# Patient Record
Sex: Female | Born: 1950 | Race: White | Hispanic: No | Marital: Married | Smoking: Never smoker
Health system: Southern US, Community
[De-identification: ages and names within clinical notes are randomized; demographics above are authoritative.]

## PROBLEM LIST (undated history)

## (undated) DIAGNOSIS — M1990 Unspecified osteoarthritis, unspecified site: Secondary | ICD-10-CM

## (undated) DIAGNOSIS — K219 Gastro-esophageal reflux disease without esophagitis: Secondary | ICD-10-CM

## (undated) DIAGNOSIS — E785 Hyperlipidemia, unspecified: Secondary | ICD-10-CM

## (undated) DIAGNOSIS — F32A Depression, unspecified: Secondary | ICD-10-CM

## (undated) DIAGNOSIS — F329 Major depressive disorder, single episode, unspecified: Secondary | ICD-10-CM

## (undated) DIAGNOSIS — R0602 Shortness of breath: Secondary | ICD-10-CM

## (undated) HISTORY — DX: Gastro-esophageal reflux disease without esophagitis: K21.9

## (undated) HISTORY — DX: Unspecified osteoarthritis, unspecified site: M19.90

## (undated) HISTORY — DX: Major depressive disorder, single episode, unspecified: F32.9

## (undated) HISTORY — DX: Hyperlipidemia, unspecified: E78.5

## (undated) HISTORY — DX: Shortness of breath: R06.02

## (undated) HISTORY — DX: Depression, unspecified: F32.A

---

## 2001-08-17 HISTORY — PX: OTHER SURGICAL HISTORY: SHX169

## 2014-10-03 ENCOUNTER — Ambulatory Visit (INDEPENDENT_AMBULATORY_CARE_PROVIDER_SITE_OTHER): Payer: Managed Care, Other (non HMO)

## 2014-10-03 ENCOUNTER — Ambulatory Visit (INDEPENDENT_AMBULATORY_CARE_PROVIDER_SITE_OTHER): Payer: Managed Care, Other (non HMO) | Admitting: Emergency Medicine

## 2014-10-03 VITALS — BP 102/68 | HR 82 | Temp 97.9°F | Resp 16 | Ht 63.0 in | Wt 156.0 lb

## 2014-10-03 DIAGNOSIS — M79641 Pain in right hand: Secondary | ICD-10-CM

## 2014-10-03 NOTE — Patient Instructions (Signed)
Hand Contusion °A hand contusion is a deep bruise on your hand area. Contusions are the result of an injury that caused bleeding under the skin. The contusion may turn blue, purple, or yellow. Minor injuries will give you a painless contusion, but more severe contusions may stay painful and swollen for a few weeks. °CAUSES  °A contusion is usually caused by a blow, trauma, or direct force to an area of the body. °SYMPTOMS  °· Swelling and redness of the injured area. °· Discoloration of the injured area. °· Tenderness and soreness of the injured area. °· Pain. °DIAGNOSIS  °The diagnosis can be made by taking a history and performing a physical exam. An X-ray, CT scan, or MRI may be needed to determine if there were any associated injuries, such as broken bones (fractures). °TREATMENT  °Often, the best treatment for a hand contusion is resting, elevating, icing, and applying cold compresses to the injured area. Over-the-counter medicines may also be recommended for pain control. °HOME CARE INSTRUCTIONS  °· Put ice on the injured area. °¨ Put ice in a plastic bag. °¨ Place a towel between your skin and the bag. °¨ Leave the ice on for 15-20 minutes, 03-04 times a day. °· Only take over-the-counter or prescription medicines as directed by your caregiver. Your caregiver may recommend avoiding anti-inflammatory medicines (aspirin, ibuprofen, and naproxen) for 48 hours because these medicines may increase bruising. °· If told, use an elastic wrap as directed. This can help reduce swelling. You may remove the wrap for sleeping, showering, and bathing. If your fingers become numb, cold, or blue, take the wrap off and reapply it more loosely. °· Elevate your hand with pillows to reduce swelling. °· Avoid overusing your hand if it is painful. °SEEK IMMEDIATE MEDICAL CARE IF:  °· You have increased redness, swelling, or pain in your hand. °· Your swelling or pain is not relieved with medicines. °· You have loss of feeling in  your hand or are unable to move your fingers. °· Your hand turns cold or blue. °· You have pain when you move your fingers. °· Your hand becomes warm to the touch. °· Your contusion does not improve in 2 days. °MAKE SURE YOU:  °· Understand these instructions. °· Will watch your condition. °· Will get help right away if you are not doing well or get worse. °Document Released: 01/23/2002 Document Revised: 04/27/2012 Document Reviewed: 01/25/2012 °ExitCare® Patient Information ©2015 [employer]. This information is not intended to replace advice given to you by your health care provider. Make sure you discuss any questions you have with your health care provider. ° °
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
## 2015-11-06 ENCOUNTER — Telehealth: Payer: Self-pay | Admitting: Cardiovascular Disease

## 2015-11-06 NOTE — Telephone Encounter (Signed)
Received records from [facility] for appointment on 11/14/15 with Dr [place].  Records given to N Hines (medical records) for Dr Formoso's schedule on 11/14/15. lp °

## 2015-11-14 ENCOUNTER — Ambulatory Visit: Payer: Self-pay | Admitting: Cardiovascular Disease

## 2015-11-22 ENCOUNTER — Encounter: Payer: Self-pay | Admitting: Cardiovascular Disease

## 2015-11-22 ENCOUNTER — Ambulatory Visit (INDEPENDENT_AMBULATORY_CARE_PROVIDER_SITE_OTHER): Payer: Managed Care, Other (non HMO) | Admitting: Cardiovascular Disease

## 2015-11-22 VITALS — BP 110/80 | HR 73 | Ht 63.0 in | Wt 157.0 lb

## 2015-11-22 DIAGNOSIS — R5383 Other fatigue: Secondary | ICD-10-CM

## 2015-11-22 DIAGNOSIS — R0602 Shortness of breath: Secondary | ICD-10-CM

## 2015-11-22 DIAGNOSIS — Z13220 Encounter for screening for lipoid disorders: Secondary | ICD-10-CM

## 2015-11-22 NOTE — Patient Instructions (Addendum)
Medication Instructions:  Your physician recommends that you continue on your current medications as directed. Please refer to the Current Medication list given to you today.  Labwork: Fasting Lipid panel first floor at [facility] lab soon  Testing/Procedures: Your physician has requested that you have en exercise stress myoview. For further information please visit [email or web address]. Please follow instruction sheet, as given.  Follow-Up: Your physician recommends that you schedule a follow-up appointment in: 1 month ov   If you need a refill on your cardiac medications before your next appointment, please call your pharmacy.

## 2015-11-22 NOTE — Progress Notes (Signed)
Cardiology Office Note   Date:  11/23/2015   ID:  Katherine Heath, DOB 1950-12-28, MRN [id number]  PCP:  No PCP Per Patient  Cardiologist:   Sharol Harness, MD   Chief Complaint  Patient presents with  . New Patient (Initial Visit)    Feels exhausted after exercise for 3 years      History of Present Illness: Katherine Heath is a 65 y.o. female who presents for an evaluation of shortness of breath.  Katherine Heath saw her PCP, Dr. [name] on 11/05/15.  At that appointment she reported shortness of breath. Her exam was unremarkable. She was referred to cardiology for further evaluation.  Hemoglobin was 14.9 and her thyroid function was stable.  She walks her dogs daily for 45 minutes most days of the week. She is noted shortness of breath and fatigue with walking. This shortness of breath improves after exercising but the fatigue persist throughout the day. On days when she does not exercise she does not have this fatigue. She does not get any chest pain, lightheadedness, dizziness, or palpitations with exertion. Several years ago she had dizziness for approximately one week but has none recently.  Her fatigue has been ongoing for approximately 3 years and is unchanged.  Katherine Heath is no swelling around her ankles and warmer weather but none lately. She denies orthopnea or PND. She does endorse snoring and her husband states that she stops breathing overnight. She had a sleep study 10-15 years ago when she had similar symptoms and was told that she did not have sleep apnea.  Her diet is good.  She eats lots of fruits and vegetables but struggles with carbohydrates.  She gained 12-14 lb in 2 years which she attributes to stopping ADHD medication.   Past Medical History  Diagnosis Date  . Depression   . GERD (gastroesophageal reflux disease)   . Arthritis   . Shortness of breath 11/23/2015    Past Surgical History  Procedure Laterality Date  . Sphincterotomy  2003     Current  Outpatient Prescriptions  Medication Sig Dispense Refill  . Cholecalciferol (VITAMIN D-3 PO) Take by mouth daily. 6000 iu daily    . clonazePAM (KLONOPIN) 0.5 MG tablet Take 0.5 mg by mouth as needed for anxiety. Take 1/2 tablet PRN    . Estriol POWD by Does not apply route 3 (three) times a week.    . Progesterone Micronized (PROGESTERONE PO) Take 70 mg by mouth daily. 5 days a week    . PROGESTERONE, VAGINAL, 4 % GEL Place vaginally daily.     No current facility-administered medications for this visit.    Allergies:   Latex    Social History:  The patient  reports that she has never smoked. She does not have any smokeless tobacco history on file. She reports that she drinks alcohol. She reports that she does not use illicit drugs.   Family History:  The patient's family history includes Heart disease in her father, maternal grandmother, and paternal grandfather; Hypertension in her maternal grandmother; Stroke in her maternal grandfather.    ROS:  Please see the history of present illness.   Otherwise, review of systems are positive for hemorrhoids.   All other systems are reviewed and negative.    PHYSICAL EXAM: VS:  BP 110/80 mmHg  Pulse 73  Ht 5\' 3"  (1.6 m)  Wt 71.215 kg (157 lb)  BMI 27.82 kg/m2 , BMI Body mass index is 27.82 kg/(m^2). GENERAL:  Well appearing HEENT:  Pupils equal round and reactive, fundi not visualized, oral mucosa unremarkable NECK:  No jugular venous distention, waveform within normal limits, carotid upstroke brisk and symmetric, no bruits, no thyromegaly LYMPHATICS:  No cervical adenopathy LUNGS:  Clear to auscultation bilaterally HEART:  RRR.  PMI not displaced or sustained,S1 and S2 within normal limits, no S3, no S4, no clicks, no rubs, no murmurs ABD:  Flat, positive bowel sounds normal in frequency in pitch, no bruits, no rebound, no guarding, no midline pulsatile mass, no hepatomegaly, no splenomegaly EXT:  2 plus pulses throughout, no edema, no  cyanosis no clubbing SKIN:  No rashes no nodules NEURO:  Cranial nerves II through XII grossly intact, motor grossly intact throughout PSYCH:  Cognitively intact, oriented to person place and time    EKG:  EKG is ordered today. The ekg ordered today demonstrates sinus rhythm rate 73 bpm.   Recent Labs: No results found for requested labs within last 365 days.   10/15/15: Sodium 137, potassium 4.4, BUN 23, creatinine 0.7 AST 19, ALT 20 WBC 8, hemoglobin 14.9, hematocrit 44.8, platelets 336 TSH 1.7  Lipid Panel No results found for: CHOL, TRIG, HDL, CHOLHDL, VLDL, LDLCALC, LDLDIRECT    Wt Readings from Last 3 Encounters:  11/22/15 71.215 kg (157 lb)  10/03/14 70.761 kg (156 lb)      ASSESSMENT AND PLAN:  # Shortness of breath, fatigue: Symptoms are atypical but could represent coronary disease. She does not have any evidence of heart failure on exam. We will obtain an exercise Cardiolite to evaluate for ischemia. Thyroid function and blood counts are stable.  # CV Risk assessment: We will obtain a fasting lipid panel.   Current medicines are reviewed at length with the patient today.  The patient does not have concerns regarding medicines.  The following changes have been made:  no change  Labs/ tests ordered today include:   Orders Placed This Encounter  Procedures  . Lipid panel  . Myocardial Perfusion Imaging  . EKG 12-Lead     Disposition:   FU with Tiffany C. Oval Linsey, MD, [facility] in 1 month   This note was written with the assistance of speech recognition software.  Please excuse any transcriptional errors.  Signed, Tiffany C. Oval Linsey, MD, [facility]  11/23/2015 7:55 AM    [place]

## 2015-11-23 ENCOUNTER — Encounter: Payer: Self-pay | Admitting: Cardiovascular Disease

## 2015-11-23 DIAGNOSIS — R0602 Shortness of breath: Secondary | ICD-10-CM

## 2015-11-23 DIAGNOSIS — R5383 Other fatigue: Secondary | ICD-10-CM | POA: Insufficient documentation

## 2015-11-23 HISTORY — DX: Shortness of breath: R06.02

## 2015-11-26 LAB — LIPID PANEL
Cholesterol: 241 mg/dL — ABNORMAL HIGH (ref 125–200)
HDL: 45 mg/dL — ABNORMAL LOW (ref >=46)
LDL Cholesterol: 165 mg/dL — ABNORMAL HIGH (ref <130)
Total CHOL/HDL Ratio: 5.4 Ratio — ABNORMAL HIGH (ref <=5.0)
Triglycerides: 154 mg/dL — ABNORMAL HIGH (ref <150)
VLDL: 31 mg/dL — ABNORMAL HIGH (ref <30)

## 2015-12-04 ENCOUNTER — Telehealth (HOSPITAL_COMMUNITY): Payer: Self-pay

## 2015-12-04 NOTE — Telephone Encounter (Signed)
Encounter complete. 

## 2015-12-06 ENCOUNTER — Ambulatory Visit (HOSPITAL_COMMUNITY)
Admission: RE | Admit: 2015-12-06 | Discharge: 2015-12-06 | Disposition: A | Discharge status: Home / Self Care | Payer: Managed Care, Other (non HMO) | Source: Ambulatory Visit | Attending: Cardiology | Admitting: Cardiology

## 2015-12-06 DIAGNOSIS — R0602 Shortness of breath: Secondary | ICD-10-CM

## 2015-12-06 DIAGNOSIS — R5383 Other fatigue: Secondary | ICD-10-CM

## 2015-12-24 ENCOUNTER — Telehealth: Payer: Self-pay | Admitting: Cardiovascular Disease

## 2015-12-27 ENCOUNTER — Ambulatory Visit: Payer: Managed Care, Other (non HMO) | Admitting: Cardiovascular Disease

## 2015-12-27 NOTE — Telephone Encounter (Signed)
Closed encounter °

## 2016-01-03 LAB — HM COLONOSCOPY

## 2016-01-09 ENCOUNTER — Ambulatory Visit: Payer: Managed Care, Other (non HMO) | Admitting: Cardiovascular Disease

## 2016-01-16 ENCOUNTER — Telehealth: Payer: Self-pay | Admitting: *Deleted

## 2016-01-16 DIAGNOSIS — K219 Gastro-esophageal reflux disease without esophagitis: Secondary | ICD-10-CM | POA: Diagnosis not present

## 2016-01-16 DIAGNOSIS — K580 Irritable bowel syndrome with diarrhea: Secondary | ICD-10-CM | POA: Diagnosis not present

## 2016-01-16 NOTE — Telephone Encounter (Signed)
Patient (pt will call back to reshcl; ah), this was cancelled on 12/24/15

## 2016-01-22 NOTE — Telephone Encounter (Signed)
Cancelled follow up ov and myoview

## 2016-01-30 ENCOUNTER — Telehealth (HOSPITAL_COMMUNITY): Payer: Self-pay | Admitting: *Deleted

## 2016-01-30 NOTE — Telephone Encounter (Signed)
Left message for patient to call regarding rescheduling myoview that she cancelled

## 2016-02-21 ENCOUNTER — Telehealth (HOSPITAL_COMMUNITY): Payer: Self-pay

## 2016-02-21 NOTE — Telephone Encounter (Signed)
Encounter complete. 

## 2016-02-26 ENCOUNTER — Ambulatory Visit (HOSPITAL_COMMUNITY)
Admission: RE | Admit: 2016-02-26 | Discharge: 2016-02-26 | Disposition: A | Discharge status: Home / Self Care | Payer: Medicare Other | Source: Ambulatory Visit | Attending: Cardiovascular Disease | Admitting: Cardiovascular Disease

## 2016-02-26 DIAGNOSIS — R0602 Shortness of breath: Secondary | ICD-10-CM | POA: Insufficient documentation

## 2016-02-26 DIAGNOSIS — R0609 Other forms of dyspnea: Secondary | ICD-10-CM | POA: Diagnosis not present

## 2016-02-26 DIAGNOSIS — R42 Dizziness and giddiness: Secondary | ICD-10-CM | POA: Insufficient documentation

## 2016-02-26 DIAGNOSIS — R5383 Other fatigue: Secondary | ICD-10-CM | POA: Diagnosis not present

## 2016-02-26 DIAGNOSIS — Z8249 Family history of ischemic heart disease and other diseases of the circulatory system: Secondary | ICD-10-CM | POA: Diagnosis not present

## 2016-02-26 LAB — MYOCARDIAL PERFUSION IMAGING
Estimated workload: 10.1 METS
Exercise duration (min): 9 min
Exercise duration (sec): 0 s
LV dias vol: 53 mL (ref 46–106)
LV sys vol: 15 mL
MPHR: 156 {beats}/min
Peak HR: 137 {beats}/min
Percent HR: 88 %
RPE: 17
Rest HR: 60 {beats}/min
SDS: 3
SRS: 0
SSS: 3
TID: 0.94

## 2016-02-26 MED ORDER — TECHNETIUM TC 99M TETROFOSMIN IV KIT
10.2000 | PACK | Freq: Once | INTRAVENOUS | Status: AC | PRN
Start: 2016-02-26 — End: 2016-02-26
  Administered 2016-02-26: 10 via INTRAVENOUS
  Filled 2016-02-26: qty 10

## 2016-02-26 MED ORDER — TECHNETIUM TC 99M TETROFOSMIN IV KIT
30.2000 | PACK | Freq: Once | INTRAVENOUS | Status: AC | PRN
  Administered 2016-02-26: 30.2 via INTRAVENOUS
  Filled 2016-02-26: qty 30

## 2016-03-13 DIAGNOSIS — H04123 Dry eye syndrome of bilateral lacrimal glands: Secondary | ICD-10-CM | POA: Diagnosis not present

## 2016-03-13 DIAGNOSIS — H2513 Age-related nuclear cataract, bilateral: Secondary | ICD-10-CM | POA: Diagnosis not present

## 2016-03-30 ENCOUNTER — Encounter: Payer: Self-pay | Admitting: Cardiovascular Disease

## 2016-03-30 ENCOUNTER — Ambulatory Visit (INDEPENDENT_AMBULATORY_CARE_PROVIDER_SITE_OTHER): Payer: Medicare Other | Admitting: Cardiovascular Disease

## 2016-03-30 VITALS — BP 120/60 | HR 85 | Ht 63.0 in | Wt 156.4 lb

## 2016-03-30 DIAGNOSIS — R5383 Other fatigue: Secondary | ICD-10-CM

## 2016-03-30 DIAGNOSIS — R0602 Shortness of breath: Secondary | ICD-10-CM | POA: Diagnosis not present

## 2016-03-30 NOTE — Progress Notes (Signed)
Cardiology Office Note   Date:  03/30/2016   ID:  Katherine Heath, DOB 10-01-1950, MRN [id number]  PCP:  [name], MD  Cardiologist:   [name], MD   Chief Complaint  Patient presents with  . Follow-up    post stress test  pt states no change      History of Present Illness: Katherine Heath is a 65 y.o. female who presents for follow-up on fatigue.  Katherine Heath saw her PCP, Dr. [name] on 11/05/15.  At that appointment she reported shortness of breath. Her exam was unremarkable. She was seen in clinic on 11/22/15.  Hemoglobin was 14.9 and her thyroid function was stable.  She reported feeling tired all day after walking her dogs. She typically walks them for 45 minutes most days of the week.  She had a sleep study 10-15 years ago when she had similar symptoms and was told that she did not have sleep apnea.  She had no evidence of heart failure on exam at that time.  She had an exercise Myoview 02/26/16 that revealed LVEF 72% and no ischemia.  She achieved 10.1 METS on a Bruce protocol.  Fasting lipids were ordered and noted to be elevated. She is advised to increase her exercise and work on her diet.  Since her last appointment Katherine Heath continues to have fatigue after exercise.  She denies chest pain or exertional shortness of breath.  She also has not noted lower extremity edema, orthopnea or PND.  She reports being tested for Lyme disease, which showed prior exposure but no active Lyme.    Past Medical History:  Diagnosis Date  . Arthritis   . Depression   . GERD (gastroesophageal reflux disease)   . Shortness of breath 11/23/2015    Past Surgical History:  Procedure Laterality Date  . sphincterotomy  2003     Current Outpatient Prescriptions  Medication Sig Dispense Refill  . Cholecalciferol (VITAMIN D-3 PO) Take by mouth daily. 6000 iu daily    . clonazePAM (KLONOPIN) 0.5 MG tablet Take 0.5 mg by mouth as needed for anxiety. Take 1/2 tablet PRN    . Estriol  POWD by Does not apply route 3 (three) times a week.    . Progesterone Micronized (PROGESTERONE PO) Take 70 mg by mouth daily. 5 days a week    . PROGESTERONE, VAGINAL, 4 % GEL Place vaginally daily.     No current facility-administered medications for this visit.     Allergies:   Latex    Social History:  The patient  reports that she has never smoked. She does not have any smokeless tobacco history on file. She reports that she drinks alcohol. She reports that she does not use drugs.   Family History:  The patient's family history includes Heart disease in her father, maternal grandmother, and paternal grandfather; Hypertension in her maternal grandmother; Stroke in her maternal grandfather.    ROS:  Please see the history of present illness.   Otherwise, review of systems are positive for hemorrhoids.   All other systems are reviewed and negative.    PHYSICAL EXAM: VS:  BP 120/60 (BP Location: Left Arm, Patient Position: Sitting, Cuff Size: Normal)   Pulse 85   Ht 5\' 3"  (1.6 m)   Wt 156 lb 6.4 oz (70.9 kg)   BMI 27.71 kg/m  , BMI Body mass index is 27.71 kg/m. GENERAL:  Well appearing HEENT:  Pupils equal round and reactive, fundi not visualized, oral  mucosa unremarkable NECK:  No jugular venous distention, waveform within normal limits, carotid upstroke brisk and symmetric, no bruits, no thyromegaly LYMPHATICS:  No cervical adenopathy LUNGS:  Clear to auscultation bilaterally HEART:  RRR.  PMI not displaced or sustained,S1 and S2 within normal limits, no S3, no S4, no clicks, no rubs, no murmurs ABD:  Flat, positive bowel sounds normal in frequency in pitch, no bruits, no rebound, no guarding, no midline pulsatile mass, no hepatomegaly, no splenomegaly EXT:  2 plus pulses throughout, no edema, no cyanosis no clubbing SKIN:  No rashes no nodules NEURO:  Cranial nerves II through XII grossly intact, motor grossly intact throughout PSYCH:  Cognitively intact, oriented to person  place and time    EKG:  EKG is not ordered today.   Exercise Myoview 02/26/16: The left ventricular ejection fraction is hyperdynamic (>65%).  Nuclear stress EF: 72%.  There was no ST segment deviation noted during stress.  The study is normal.  This is a low risk study.   Normal exercise nuclear stress test with no evidence of prior infarct or ischemia.  Excellent exercise capacity. Normal BP response to stress.   Recent Labs: No results found for requested labs within last 8760 hours.   10/15/15: Sodium 137, potassium 4.4, BUN 23, creatinine 0.7 AST 19, ALT 20 WBC 8, hemoglobin 14.9, hematocrit 44.8, platelets 336 TSH 1.7  Lipid Panel    Component Value Date/Time   CHOL 241 (H) 11/26/2015 0824   TRIG 154 (H) 11/26/2015 0824   HDL 45 (L) 11/26/2015 0824   CHOLHDL 5.4 (H) 11/26/2015 0824   VLDL 31 (H) 11/26/2015 0824   LDLCALC 165 (H) 11/26/2015 0824      Wt Readings from Last 3 Encounters:  03/30/16 156 lb 6.4 oz (70.9 kg)  02/26/16 157 lb (71.2 kg)  11/22/15 157 lb (71.2 kg)      ASSESSMENT AND PLAN:  # Shortness of breath, fatigue: Symptoms are atypical and exercise Myoveiw was negative for ischemia.  LVEF was 72%.  We will refer her for an echo to ensure she does not have diastolic dysfunction and PFTs.      Current medicines are reviewed at length with the patient today.  The patient does not have concerns regarding medicines.  The following changes have been made:  no change  Labs/ tests ordered today include:   Orders Placed This Encounter  Procedures  . ECHOCARDIOGRAM COMPLETE  . Pulmonary function test     Disposition:   FU with Tiffany C. Oval Linsey, MD, [facility] as needed.    This note was written with the assistance of speech recognition software.  Please excuse any transcriptional errors.  Signed, Tiffany C. Oval Linsey, MD, [facility]  03/30/2016 2:22 PM    [facility]

## 2016-03-30 NOTE — Patient Instructions (Addendum)
Medication Instructions:  Your physician recommends that you continue on your current medications as directed. Please refer to the Current Medication list given to you today.  Labwork: NONE  Testing/Procedures: Your physician has requested that you have an echocardiogram. Echocardiography is a painless test that uses sound waves to create images of your heart. It provides your doctor with information about the size and shape of your heart and how well your heart's chambers and valves are working. This procedure takes approximately one hour. There are no restrictions for this procedure. [place] has recommended that you have a pulmonary function test. Pulmonary Function Tests are a group of tests that measure how well air moves in and out of your lungs.  Follow-Up: AS NEEDED   If you need a refill on your cardiac medications before your next appointment, please call your pharmacy.

## 2016-04-07 DIAGNOSIS — Z79899 Other long term (current) drug therapy: Secondary | ICD-10-CM | POA: Diagnosis not present

## 2016-04-07 DIAGNOSIS — Z6827 Body mass index (BMI) 27.0-27.9, adult: Secondary | ICD-10-CM | POA: Diagnosis not present

## 2016-04-07 DIAGNOSIS — E559 Vitamin D deficiency, unspecified: Secondary | ICD-10-CM | POA: Diagnosis not present

## 2016-04-07 DIAGNOSIS — N951 Menopausal and female climacteric states: Secondary | ICD-10-CM | POA: Diagnosis not present

## 2016-04-07 DIAGNOSIS — Z0000 Encounter for general adult medical examination without abnormal findings: Secondary | ICD-10-CM | POA: Diagnosis not present

## 2016-04-07 DIAGNOSIS — R5383 Other fatigue: Secondary | ICD-10-CM | POA: Diagnosis not present

## 2016-04-09 ENCOUNTER — Ambulatory Visit (HOSPITAL_COMMUNITY): Payer: Medicare Other | Attending: Cardiology

## 2016-04-09 ENCOUNTER — Other Ambulatory Visit: Payer: Self-pay

## 2016-04-09 DIAGNOSIS — I351 Nonrheumatic aortic (valve) insufficiency: Secondary | ICD-10-CM | POA: Insufficient documentation

## 2016-04-09 DIAGNOSIS — R0600 Dyspnea, unspecified: Secondary | ICD-10-CM | POA: Diagnosis present

## 2016-04-09 DIAGNOSIS — R0602 Shortness of breath: Secondary | ICD-10-CM | POA: Insufficient documentation

## 2016-04-09 DIAGNOSIS — R5383 Other fatigue: Secondary | ICD-10-CM

## 2016-04-13 ENCOUNTER — Ambulatory Visit (INDEPENDENT_AMBULATORY_CARE_PROVIDER_SITE_OTHER): Payer: Medicare Other | Admitting: Internal Medicine

## 2016-04-13 DIAGNOSIS — R0602 Shortness of breath: Secondary | ICD-10-CM | POA: Diagnosis not present

## 2016-04-13 DIAGNOSIS — R5383 Other fatigue: Secondary | ICD-10-CM

## 2016-04-13 LAB — PULMONARY FUNCTION TEST
DL/VA % pred: 79 %
DL/VA: 3.72 ml/min/mmHg/L
DLCO cor % pred: 74 %
DLCO cor: 17.12 ml/min/mmHg
DLCO unc % pred: 78 %
DLCO unc: 18.09 ml/min/mmHg
FEF 25-75 Post: 2.13 L/sec
FEF 25-75 Pre: 2.68 L/sec
FEF2575-%Change-Post: -20 %
FEF2575-%Pred-Post: 103 %
FEF2575-%Pred-Pre: 130 %
FEV1-%Change-Post: -4 %
FEV1-%Pred-Post: 107 %
FEV1-%Pred-Pre: 112 %
FEV1-Post: 2.5 L
FEV1-Pre: 2.61 L
FEV1FVC-%Change-Post: 2 %
FEV1FVC-%Pred-Pre: 107 %
FEV6-%Change-Post: -6 %
FEV6-%Pred-Post: 100 %
FEV6-%Pred-Pre: 108 %
FEV6-Post: 2.94 L
FEV6-Pre: 3.15 L
FEV6FVC-%Pred-Post: 104 %
FEV6FVC-%Pred-Pre: 104 %
FVC-%Change-Post: -6 %
FVC-%Pred-Post: 96 %
FVC-%Pred-Pre: 104 %
FVC-Post: 2.94 L
FVC-Pre: 3.15 L
Post FEV1/FVC ratio: 85 %
Post FEV6/FVC ratio: 100 %
Pre FEV1/FVC ratio: 83 %
Pre FEV6/FVC Ratio: 100 %
RV % pred: 96 %
RV: 1.97 L
TLC % pred: 103 %
TLC: 5.05 L

## 2016-04-14 ENCOUNTER — Telehealth: Payer: Self-pay | Admitting: Cardiovascular Disease

## 2016-04-14 NOTE — Telephone Encounter (Signed)
Advised patient of echo results She was concerned about her diastolic dysfunction grade 1 so recall placed for 1 year

## 2016-04-14 NOTE — Telephone Encounter (Signed)
-----   Message from [name], MD sent at 04/10/2016  1:46 PM EDT ----- Echo shows that her heart squeezes well but does not relax completely.  This is a mild change and will not cause symptoms unless it worsens.  It will be important to keep her blood pressure under good control.  Otherwise normal

## 2016-04-14 NOTE — Telephone Encounter (Signed)
Follow Up:; ° ° °Returning your call. °

## 2016-05-14 ENCOUNTER — Telehealth: Payer: Self-pay | Admitting: *Deleted

## 2016-05-14 DIAGNOSIS — R942 Abnormal results of pulmonary function studies: Secondary | ICD-10-CM

## 2016-05-14 NOTE — Telephone Encounter (Signed)
Left message to call back  

## 2016-05-20 DIAGNOSIS — R229 Localized swelling, mass and lump, unspecified: Secondary | ICD-10-CM | POA: Diagnosis not present

## 2016-05-21 NOTE — Telephone Encounter (Signed)
New message ° °Pt returning RN call. Please call back to discuss  °

## 2016-05-21 NOTE — Telephone Encounter (Signed)
Referral placed to pulmonary for mildly abnormal PFT's

## 2016-05-22 DIAGNOSIS — H25013 Cortical age-related cataract, bilateral: Secondary | ICD-10-CM | POA: Diagnosis not present

## 2016-05-22 DIAGNOSIS — H2513 Age-related nuclear cataract, bilateral: Secondary | ICD-10-CM | POA: Diagnosis not present

## 2016-05-22 DIAGNOSIS — H2512 Age-related nuclear cataract, left eye: Secondary | ICD-10-CM | POA: Diagnosis not present

## 2016-05-26 ENCOUNTER — Ambulatory Visit (INDEPENDENT_AMBULATORY_CARE_PROVIDER_SITE_OTHER)
Admission: RE | Admit: 2016-05-26 | Discharge: 2016-05-26 | Disposition: A | Discharge status: Home / Self Care | Payer: Medicare Other | Source: Ambulatory Visit | Attending: Pulmonary Disease | Admitting: Pulmonary Disease

## 2016-05-26 ENCOUNTER — Encounter: Payer: Self-pay | Admitting: Pulmonary Disease

## 2016-05-26 ENCOUNTER — Ambulatory Visit (INDEPENDENT_AMBULATORY_CARE_PROVIDER_SITE_OTHER): Payer: Medicare Other | Admitting: Pulmonary Disease

## 2016-05-26 VITALS — BP 112/62 | HR 79 | Ht 63.0 in | Wt 156.0 lb

## 2016-05-26 DIAGNOSIS — R0600 Dyspnea, unspecified: Secondary | ICD-10-CM

## 2016-05-26 DIAGNOSIS — R0609 Other forms of dyspnea: Secondary | ICD-10-CM

## 2016-05-26 DIAGNOSIS — R5383 Other fatigue: Secondary | ICD-10-CM | POA: Diagnosis not present

## 2016-05-26 DIAGNOSIS — R069 Unspecified abnormalities of breathing: Secondary | ICD-10-CM | POA: Diagnosis not present

## 2016-05-26 MED ORDER — ALBUTEROL SULFATE HFA 108 (90 BASE) MCG/ACT IN AERS: 2.0000 | INHALATION_SPRAY | Freq: Four times a day (QID) | RESPIRATORY_TRACT | 6 refills | Status: DC | PRN

## 2016-05-26 NOTE — Patient Instructions (Signed)
It was a pleasure taking care of you today!   We will order Albuterol 2 puffs every 4 hours as needed for shortness of breath.   Please call the office if you are having issues with your medications  We will get a chest x-ray and call you with results.  Let us know if they're worsening of symptoms.  Follow-up as needed.

## 2016-05-26 NOTE — Progress Notes (Signed)
Subjective:    Patient ID: Katherine Heath, female    DOB: 09-08-1950, 65 y.o.   MRN: [id number]  HPI   This is the case of Katherine Heath, 65 y.o. Female, who was referred by Dr. [name]  in consultation regarding "exhaustion".    As you very well know, patient is a non smoker, not been diagnosed with asthma or copd.   Patient does vigorous walking daily with her dogs, walks 40-50 minutes fast walking. She has been doing this x 10 yrs. She does yoga 3x/week. Initially, she was just walking her dog and was doing this for 7 years before she ended up getting another dog. The last 3 years, she has been walking 2 dogs. The second dog is more energetic and vigorous. Since walking with 2 dogs the last 3 years, she gets exhausted every time. She thought to time her exhaustion will get better. It has not. She was concerned about it so she mentioned it to her primary care doctor. She ended up seeing you and her cardiac workup was unremarkable. He ordered pulmonary function test.  PFT if I read it is normal. FEV1-FVC ratio was 83%. FEV1 2.61 or 112% of predicted. No significant bronchodilator change. Lung volumes are normal. Diffusion capacity was 78%.  She does not complain of exertional dyspnea. She just feels exhausted after walking 2 dogs for 15 minutes. The exhaustion is similar 3 years ago. If she doesn't walk the dogs, she will not be exhausted and she will be fine.   Review of Systems  Constitutional: Negative.  Negative for fever and unexpected weight change.  HENT: Negative.  Negative for congestion, dental problem, ear pain, nosebleeds, postnasal drip, rhinorrhea, sinus pressure, sneezing, sore throat and trouble swallowing.   Eyes: Negative.  Negative for redness and itching.  Respiratory: Negative.  Negative for cough, chest tightness, shortness of breath and wheezing.   Cardiovascular: Negative.  Negative for palpitations and leg swelling.  Gastrointestinal: Negative.  Negative  for nausea and vomiting.  Endocrine: Negative.   Genitourinary: Negative.  Negative for dysuria.  Musculoskeletal: Positive for arthralgias. Negative for joint swelling.  Skin: Negative.  Negative for rash.  Allergic/Immunologic: Positive for environmental allergies.  Neurological: Negative.  Negative for headaches.  Hematological: Negative.  Does not bruise/bleed easily.  Psychiatric/Behavioral: Negative.  Negative for dysphoric mood. The patient is not nervous/anxious.    Past Medical History:  Diagnosis Date  . Arthritis   . Depression   . GERD (gastroesophageal reflux disease)   . Shortness of breath 11/23/2015   (-) CA.   Family History  Problem Relation Age of Onset  . Heart disease Father   . Heart disease Maternal Grandmother   . Hypertension Maternal Grandmother   . Heart disease Paternal Grandfather   . Stroke Maternal Grandfather      Past Surgical History:  Procedure Laterality Date  . sphincterotomy  2003    Social History   Social History  . Marital status: Married    Spouse name: N/A  . Number of children: N/A  . Years of education: N/A   Occupational History  . Not on file.   Social History Main Topics  . Smoking status: Never Smoker  . Smokeless tobacco: Not on file  . Alcohol use 0.0 oz/week  . Drug use: No  . Sexual activity: Not on file   Other Topics Concern  . Not on file   Social History Narrative  . No narrative on file   Worked  as a speech and Land. Occasionally ETOH.   Allergies  Allergen Reactions  . Latex Itching     Outpatient Medications Prior to Visit  Medication Sig Dispense Refill  . Cholecalciferol (VITAMIN D-3 PO) Take by mouth daily. 6000 iu daily    . clonazePAM (KLONOPIN) 0.5 MG tablet Take 0.5 mg by mouth as needed for anxiety. Take 1/2 tablet PRN    . Estriol POWD by Does not apply route 3 (three) times a week.    . Progesterone Micronized (PROGESTERONE PO) Take 70 mg by mouth daily. 5 days a week     . PROGESTERONE, VAGINAL, 4 % GEL Place vaginally daily.     No facility-administered medications prior to visit.    Meds ordered this encounter  Medications  . albuterol (PROVENTIL HFA;VENTOLIN HFA) 108 (90 Base) MCG/ACT inhaler    Sig: Inhale 2 puffs into the lungs every 6 (six) hours as needed for wheezing or shortness of breath.    Dispense:  1 Inhaler    Refill:  6        Objective:   Physical Exam  Vitals:  Vitals:   05/26/16 1531  BP: 112/62  Pulse: 79  SpO2: 93%  Weight: 156 lb (70.8 kg)  Height: 5\' 3"  (1.6 m)    Constitutional/General:  Pleasant, well-nourished, well-developed, not in any distress,  Comfortably seating.  Well kempt  Body mass index is 27.63 kg/m. Wt Readings from Last 3 Encounters:  05/26/16 156 lb (70.8 kg)  03/30/16 156 lb 6.4 oz (70.9 kg)  02/26/16 157 lb (71.2 kg)    HEENT: Pupils equal and reactive to light and accommodation. Anicteric sclerae. Normal nasal mucosa.   No oral  lesions,  mouth clear,  oropharynx clear, no postnasal drip. (-) Oral thrush. No dental caries.  Airway - Mallampati class III  Neck: No masses. Midline trachea. No JVD, (-) LAD. (-) bruits appreciated.  Respiratory/Chest: Grossly normal chest. (-) deformity. (-) Accessory muscle use.  Symmetric expansion. (-) Tenderness on palpation.  Resonant on percussion.  Diminished BS on both lower lung zones. (-) wheezing, crackles, rhonchi (-) egophony  Cardiovascular: Regular rate and  rhythm, heart sounds normal, no murmur or gallops, no peripheral edema  Gastrointestinal:  Normal bowel sounds. Soft, non-tender. No hepatosplenomegaly.  (-) masses.   Musculoskeletal:  Normal muscle tone. Normal gait.   Extremities: Grossly normal. (-) clubbing, cyanosis.  (-) edema  Skin: (-) rash,lesions seen.   Neurological/Psychiatric : alert, oriented to time, place, person. Normal mood and affect          Assessment & Plan:  Exhaustion Patient is being  seen for exhaustion. She denies exertional dyspnea. Nonsmoker. No lung disease. She has been walking 1-2 dogs the last 10 years. It is a fast vigorous walk she does 3 times a week. She ends up walking 40-50 minutes each time. At the end of that walk, she gets exhausted. If she does not walk the dogs, she is okay. Cardiac workup has been unremarkable. PFT was unremarkable. Patient does yoga as well. I mentioned to the patient that she does not have COPD based on PFT. She may have very mild COPD (she had 2nd hand smoke from grandparents) which gets symptomatic after doing vigorous exercise.  On a regular day, she is not too symptomatic.  Plan : 1. Albuterol, 2 puffs every 4 hours when necessary for dyspnea. Advised her to use it also before the vigorous walk.  2. CXR to make sure there is  nothing anatomic causing her to have exertional dyspnea. 3. Told her to give [place] a call if she gets more symptomatic. At that point, she will probably be on maintenance medicine. 4. She had a sleep study 5 years ago which was unremarkable. She denies being exhausted during the days that she is not walking the dogs. Told the patient to watch out for signs and symptoms of sleep apnea. If she gets more symptomatic, she may end up needing another sleep study.   Patient does not want to have vaccines.  Thank you very much for letting me participate in this patient's care. Please do not hesitate to give me a call if you have any questions or concerns regarding the treatment plan.   Patient will follow up with me as needed.    Monica Becton, MD 05/26/2016   4:03 PM [facility] and [place] Pager: [phone] Office: [phone], Fax: [phone]

## 2016-05-26 NOTE — Assessment & Plan Note (Signed)
Patient is being seen for exhaustion. She denies exertional dyspnea. Nonsmoker. No lung disease. She has been walking 1-2 dogs the last 10 years. It is a fast vigorous walk she does 3 times a week. She ends up walking 40-50 minutes each time. At the end of that walk, she gets exhausted. If she does not walk the dogs, she is okay. Cardiac workup has been unremarkable. PFT was unremarkable. Patient does yoga as well. I mentioned to the patient that she does not have COPD based on PFT. She may have very mild COPD (she had 2nd hand smoke from grandparents) which gets symptomatic after doing vigorous exercise.  On a regular day, she is not too symptomatic.  Plan : 1. Albuterol, 2 puffs every 4 hours when necessary for dyspnea. Advised her to use it also before the vigorous walk.  2. CXR to make sure there is nothing anatomic causing her to have exertional dyspnea. 3. Told her to give [place] a call if she gets more symptomatic. At that point, she will probably be on maintenance medicine. 4. She had a sleep study 5 years ago which was unremarkable. She denies being exhausted during the days that she is not walking the dogs. Told the patient to watch out for signs and symptoms of sleep apnea. If she gets more symptomatic, she may end up needing another sleep study.

## 2016-05-27 ENCOUNTER — Telehealth: Payer: Self-pay | Admitting: Pulmonary Disease

## 2016-05-27 DIAGNOSIS — J849 Interstitial pulmonary disease, unspecified: Secondary | ICD-10-CM

## 2016-05-27 NOTE — Telephone Encounter (Signed)
Notes Recorded by Rush Landmark, MD on 05/27/2016 at 8:48 AM EDT pls tell Katherine Heath CXR showed mild "scarring at the bases" (possible). We can observe for now. Or if she wants, we can get chest CT scan. Thanks.   Called and spoke with Katherine Heath and gave her the results of her cxr.  She has further questions:  She wanted to know what could the scarring come from?  She does not remember ever having bronchitis/PNA, etc.  She stated that she was exposed to second hand smoke as a child, so could this have anything to do with the scarring?  Katherine Heath stated that she just did not have enough answers to her questions to decide on what would be best.  She stated that she would do the CT if needed.  AD please advise further. thanks

## 2016-05-28 NOTE — Telephone Encounter (Signed)
Patient returned call - she can be reached at [phone]

## 2016-05-28 NOTE — Telephone Encounter (Signed)
Called and relayed detailed results with patient.  She is requesting to proceed with CT chest.  This has been ordered.  Nothing further needed at this time.

## 2016-05-28 NOTE — Telephone Encounter (Signed)
   I tried calling the patient to discuss with her results of chest x-ray. Left a voicemail.  Katherine Heath/Katherine Heath : Please try calling patient again. CXR with "possible scarring" at the bases could be mild scar from previous infection. CXR findings could also be "atelectasis" or part of the lungs collapsed.   If she feels that her breathing is an issue, we need to get a chest ct scan (no contrast, dx: ILD). If her breathing she feels is stable, we can hold off on the chest ct scan and observe for worsening of sx.   Thanks.  Monica Becton, MD 05/28/2016, 2:34 PM [place] [facility] Pager [phone] After 3 pm or if no answer, call [phone]

## 2016-06-05 ENCOUNTER — Ambulatory Visit (INDEPENDENT_AMBULATORY_CARE_PROVIDER_SITE_OTHER)
Admission: RE | Admit: 2016-06-05 | Discharge: 2016-06-05 | Disposition: A | Discharge status: Home / Self Care | Payer: Medicare Other | Source: Ambulatory Visit | Attending: Pulmonary Disease | Admitting: Pulmonary Disease

## 2016-06-05 DIAGNOSIS — J849 Interstitial pulmonary disease, unspecified: Secondary | ICD-10-CM

## 2016-06-05 DIAGNOSIS — K449 Diaphragmatic hernia without obstruction or gangrene: Secondary | ICD-10-CM | POA: Diagnosis not present

## 2016-06-10 NOTE — Progress Notes (Signed)
Tried to call pt. But no vm set up yet x1

## 2016-06-17 NOTE — Progress Notes (Signed)
Spoke with Pt. And told her AD recommendations, Pt. Understood stated she will call us if she does want the new inhaler, Nothing further needed.

## 2016-06-19 DIAGNOSIS — H2512 Age-related nuclear cataract, left eye: Secondary | ICD-10-CM | POA: Diagnosis not present

## 2016-06-19 DIAGNOSIS — H2513 Age-related nuclear cataract, bilateral: Secondary | ICD-10-CM | POA: Diagnosis not present

## 2016-07-21 DIAGNOSIS — R238 Other skin changes: Secondary | ICD-10-CM | POA: Diagnosis not present

## 2016-07-21 DIAGNOSIS — Z79899 Other long term (current) drug therapy: Secondary | ICD-10-CM | POA: Diagnosis not present

## 2016-07-21 DIAGNOSIS — R946 Abnormal results of thyroid function studies: Secondary | ICD-10-CM | POA: Diagnosis not present

## 2016-07-21 DIAGNOSIS — N951 Menopausal and female climacteric states: Secondary | ICD-10-CM | POA: Diagnosis not present

## 2016-07-21 DIAGNOSIS — Z1389 Encounter for screening for other disorder: Secondary | ICD-10-CM | POA: Diagnosis not present

## 2016-07-21 DIAGNOSIS — R5383 Other fatigue: Secondary | ICD-10-CM | POA: Diagnosis not present

## 2016-08-24 DIAGNOSIS — Z23 Encounter for immunization: Secondary | ICD-10-CM | POA: Diagnosis not present

## 2016-08-24 DIAGNOSIS — D485 Neoplasm of uncertain behavior of skin: Secondary | ICD-10-CM | POA: Diagnosis not present

## 2016-08-24 DIAGNOSIS — L821 Other seborrheic keratosis: Secondary | ICD-10-CM | POA: Diagnosis not present

## 2016-08-25 DIAGNOSIS — B079 Viral wart, unspecified: Secondary | ICD-10-CM | POA: Diagnosis not present

## 2016-09-01 DIAGNOSIS — M810 Age-related osteoporosis without current pathological fracture: Secondary | ICD-10-CM | POA: Diagnosis not present

## 2016-09-01 DIAGNOSIS — N951 Menopausal and female climacteric states: Secondary | ICD-10-CM | POA: Diagnosis not present

## 2016-09-01 DIAGNOSIS — Z79890 Hormone replacement therapy (postmenopausal): Secondary | ICD-10-CM | POA: Diagnosis not present

## 2016-09-01 DIAGNOSIS — M25561 Pain in right knee: Secondary | ICD-10-CM | POA: Diagnosis not present

## 2016-09-15 DIAGNOSIS — R229 Localized swelling, mass and lump, unspecified: Secondary | ICD-10-CM | POA: Diagnosis not present

## 2016-09-15 DIAGNOSIS — M25861 Other specified joint disorders, right knee: Secondary | ICD-10-CM | POA: Diagnosis not present

## 2016-09-15 DIAGNOSIS — R2232 Localized swelling, mass and lump, left upper limb: Secondary | ICD-10-CM | POA: Diagnosis not present

## 2016-11-09 DIAGNOSIS — D1801 Hemangioma of skin and subcutaneous tissue: Secondary | ICD-10-CM | POA: Diagnosis not present

## 2016-11-09 DIAGNOSIS — L851 Acquired keratosis [keratoderma] palmaris et plantaris: Secondary | ICD-10-CM | POA: Diagnosis not present

## 2016-11-09 DIAGNOSIS — D225 Melanocytic nevi of trunk: Secondary | ICD-10-CM | POA: Diagnosis not present

## 2016-11-09 DIAGNOSIS — L814 Other melanin hyperpigmentation: Secondary | ICD-10-CM | POA: Diagnosis not present

## 2016-11-09 DIAGNOSIS — L821 Other seborrheic keratosis: Secondary | ICD-10-CM | POA: Diagnosis not present

## 2016-11-17 DIAGNOSIS — Z712 Person consulting for explanation of examination or test findings: Secondary | ICD-10-CM | POA: Diagnosis not present

## 2016-11-17 DIAGNOSIS — Z6828 Body mass index (BMI) 28.0-28.9, adult: Secondary | ICD-10-CM | POA: Diagnosis not present

## 2016-11-17 DIAGNOSIS — Z79890 Hormone replacement therapy (postmenopausal): Secondary | ICD-10-CM | POA: Diagnosis not present

## 2016-11-17 DIAGNOSIS — N951 Menopausal and female climacteric states: Secondary | ICD-10-CM | POA: Diagnosis not present

## 2016-11-17 NOTE — Progress Notes (Signed)
PFT done.  

## 2017-01-26 DIAGNOSIS — Z6828 Body mass index (BMI) 28.0-28.9, adult: Secondary | ICD-10-CM | POA: Diagnosis not present

## 2017-01-26 DIAGNOSIS — N951 Menopausal and female climacteric states: Secondary | ICD-10-CM | POA: Diagnosis not present

## 2017-01-26 DIAGNOSIS — Z79899 Other long term (current) drug therapy: Secondary | ICD-10-CM | POA: Diagnosis not present

## 2017-01-26 DIAGNOSIS — Z23 Encounter for immunization: Secondary | ICD-10-CM | POA: Diagnosis not present

## 2017-04-20 DIAGNOSIS — E785 Hyperlipidemia, unspecified: Secondary | ICD-10-CM | POA: Diagnosis not present

## 2017-04-20 DIAGNOSIS — Z0000 Encounter for general adult medical examination without abnormal findings: Secondary | ICD-10-CM | POA: Diagnosis not present

## 2017-04-20 DIAGNOSIS — E559 Vitamin D deficiency, unspecified: Secondary | ICD-10-CM | POA: Diagnosis not present

## 2017-04-20 DIAGNOSIS — R7309 Other abnormal glucose: Secondary | ICD-10-CM | POA: Diagnosis not present

## 2017-04-20 DIAGNOSIS — M79622 Pain in left upper arm: Secondary | ICD-10-CM | POA: Diagnosis not present

## 2017-04-20 DIAGNOSIS — N951 Menopausal and female climacteric states: Secondary | ICD-10-CM | POA: Diagnosis not present

## 2017-05-04 DIAGNOSIS — M67912 Unspecified disorder of synovium and tendon, left shoulder: Secondary | ICD-10-CM | POA: Diagnosis not present

## 2017-05-05 DIAGNOSIS — M25512 Pain in left shoulder: Secondary | ICD-10-CM | POA: Diagnosis not present

## 2017-05-05 DIAGNOSIS — M7542 Impingement syndrome of left shoulder: Secondary | ICD-10-CM | POA: Diagnosis not present

## 2017-05-10 DIAGNOSIS — M25512 Pain in left shoulder: Secondary | ICD-10-CM | POA: Diagnosis not present

## 2017-05-10 DIAGNOSIS — M7542 Impingement syndrome of left shoulder: Secondary | ICD-10-CM | POA: Diagnosis not present

## 2017-05-12 DIAGNOSIS — M25512 Pain in left shoulder: Secondary | ICD-10-CM | POA: Diagnosis not present

## 2017-05-12 DIAGNOSIS — M7542 Impingement syndrome of left shoulder: Secondary | ICD-10-CM | POA: Diagnosis not present

## 2017-05-17 DIAGNOSIS — M7542 Impingement syndrome of left shoulder: Secondary | ICD-10-CM | POA: Diagnosis not present

## 2017-05-17 DIAGNOSIS — M25512 Pain in left shoulder: Secondary | ICD-10-CM | POA: Diagnosis not present

## 2017-05-19 DIAGNOSIS — M25512 Pain in left shoulder: Secondary | ICD-10-CM | POA: Diagnosis not present

## 2017-05-19 DIAGNOSIS — M7542 Impingement syndrome of left shoulder: Secondary | ICD-10-CM | POA: Diagnosis not present

## 2017-05-24 DIAGNOSIS — M25512 Pain in left shoulder: Secondary | ICD-10-CM | POA: Diagnosis not present

## 2017-05-24 DIAGNOSIS — M7542 Impingement syndrome of left shoulder: Secondary | ICD-10-CM | POA: Diagnosis not present

## 2017-05-31 DIAGNOSIS — M7542 Impingement syndrome of left shoulder: Secondary | ICD-10-CM | POA: Diagnosis not present

## 2017-05-31 DIAGNOSIS — M25512 Pain in left shoulder: Secondary | ICD-10-CM | POA: Diagnosis not present

## 2017-06-02 DIAGNOSIS — M7542 Impingement syndrome of left shoulder: Secondary | ICD-10-CM | POA: Diagnosis not present

## 2017-06-02 DIAGNOSIS — M25512 Pain in left shoulder: Secondary | ICD-10-CM | POA: Diagnosis not present

## 2017-06-14 DIAGNOSIS — M25512 Pain in left shoulder: Secondary | ICD-10-CM | POA: Diagnosis not present

## 2017-06-14 DIAGNOSIS — M7542 Impingement syndrome of left shoulder: Secondary | ICD-10-CM | POA: Diagnosis not present

## 2017-06-16 DIAGNOSIS — M25512 Pain in left shoulder: Secondary | ICD-10-CM | POA: Diagnosis not present

## 2017-06-16 DIAGNOSIS — M7542 Impingement syndrome of left shoulder: Secondary | ICD-10-CM | POA: Diagnosis not present

## 2017-06-17 ENCOUNTER — Ambulatory Visit (INDEPENDENT_AMBULATORY_CARE_PROVIDER_SITE_OTHER): Payer: Medicare Other | Admitting: Cardiovascular Disease

## 2017-06-17 ENCOUNTER — Encounter: Payer: Self-pay | Admitting: Cardiovascular Disease

## 2017-06-17 VITALS — BP 115/76 | HR 71 | Ht 63.0 in | Wt 159.2 lb

## 2017-06-17 DIAGNOSIS — E7800 Pure hypercholesterolemia, unspecified: Secondary | ICD-10-CM

## 2017-06-17 DIAGNOSIS — R5383 Other fatigue: Secondary | ICD-10-CM

## 2017-06-17 DIAGNOSIS — E785 Hyperlipidemia, unspecified: Secondary | ICD-10-CM

## 2017-06-17 HISTORY — DX: Hyperlipidemia, unspecified: E78.5

## 2017-06-17 NOTE — Patient Instructions (Addendum)
Medication Instructions:  .Your physician recommends that you continue on your current medications as directed. Please refer to the Current Medication list given to you today.  Labwork: none  Testing/Procedures: none  Follow-Up: As needed   

## 2017-06-17 NOTE — Progress Notes (Signed)
Cardiology Office Note   Date:  06/17/2017   ID:  Katherine Heath, DOB 02-19-1951, MRN [id number]  PCP:  Katherine Chard, MD  Cardiologist:   Katherine Latch, MD   No chief complaint on file.    History of Present Illness: Katherine Heath is a 66 y.o. female who presents for follow-up.  She was initially seen 11/2015 for fatigue and shortness of breath.  Hemoglobin was 14.9 and her thyroid function was stable.  She had a sleep study 10-15 years ago when she had similar symptoms and was told that she did not have sleep apnea.  She had no evidence of heart failure on exam at that time.  She had an exercise Myoview 02/26/16 that revealed LVEF 72% and no ischemia.  She achieved 10.1 METS on a Bruce protocol.  Fasting lipids were ordered and noted to be elevated. She is advised to increase her exercise and work on her diet.  She had an echocardiogram 03/2016 that revealed LVEF 60-65% and grade 1 diastolic dysfunction.  She reports being tested for Lyme disease, which showed prior exposure but no active Lyme.  She had pulmonary function testing 03/2016 that revealed minimal diffusion defect.  She followed up with Dr. [name] of pulmonary who felt that her PFTs were normal, though she may have very mild COPD.  She was given an albuterol inhaler.  She had a chest CT that showed no evidence of interstitial lung disease but she did have some small airways disease.  She was noted to have aortic atherosclerosis and a moderate hiatal hernia.  There were no coronary calcifications noted.  Since her last appointment Katherine Heath has been feeling well.  She continues to feel very fatigued after heavy exertion.  However, she is still able to exercise daily.  Today she walked for nearly an hour, and did 20-30 minutes of yoga and bike for 20 minutes.  This is a typical day for her.  She has no exertional chest pain or shortness of breath but feels wiped out after these activities.  She has occasional edema in her right ankle.   This improves with water and lemon juice.  She denies orthopnea or PND.  She notes that the albuterol inhaler did not improve her symptoms.  Overall her diet is very healthy.  She limits fried food and fatty foods.  She does like cheese.   Past Medical History:  Diagnosis Date  . Arthritis   . Depression   . GERD (gastroesophageal reflux disease)   . Hyperlipidemia 06/17/2017  . Shortness of breath 11/23/2015    Past Surgical History:  Procedure Laterality Date  . sphincterotomy  2003     Current Outpatient Prescriptions  Medication Sig Dispense Refill  . Calcium Carbonate-Vit D-Min (CALCIUM 1200 PO) Take by mouth.    . Cholecalciferol (VITAMIN D-3 PO) Take by mouth daily. 6000 iu daily    . Estriol POWD by Does not apply route 3 (three) times a week.    . magnesium 30 MG tablet Take 30 mg by mouth daily.    . Progesterone Micronized (PROGESTERONE PO) Take 70 mg by mouth daily. 5 days a week    . PROGESTERONE, VAGINAL, 4 % GEL Place 1 applicator vaginally 3 (three) times a week.     . TURMERIC PO Take by mouth.     No current facility-administered medications for this visit.     Allergies:   Latex    Social History:  The patient  reports that  she has never smoked. She has never used smokeless tobacco. She reports that she drinks alcohol. She reports that she does not use drugs.   Family History:  The patient's family history includes Heart disease in her father, maternal grandmother, and paternal grandfather; Hypertension in her maternal grandmother; Stroke in her maternal grandfather.    ROS:  Please see the history of present illness.   Otherwise, review of systems are positive for none.   All other systems are reviewed and negative.    PHYSICAL EXAM: VS:  BP 115/76   Pulse 71   Ht 5\' 3"  (1.6 m)   Wt 72.2 kg (159 lb 3.2 oz)   BMI 28.20 kg/m  , BMI Body mass index is 28.2 kg/m. GENERAL:  Well appearing HEENT: Pupils equal round and reactive, fundi not visualized, oral  mucosa unremarkable NECK:  No jugular venous distention, waveform within normal limits, carotid upstroke brisk and symmetric, no bruits, no thyromegaly LUNGS:  Clear to auscultation bilaterally HEART:  RRR.  PMI not displaced or sustained,S1 and S2 within normal limits, no S3, no S4, no clicks, no rubs, no murmurs ABD:  Flat, positive bowel sounds normal in frequency in pitch, no bruits, no rebound, no guarding, no midline pulsatile mass, no hepatomegaly, no splenomegaly EXT:  2 plus pulses throughout, no edema, no cyanosis no clubbing SKIN:  No rashes no nodules NEURO:  Cranial nerves II through XII grossly intact, motor grossly intact throughout PSYCH:  Cognitively intact, oriented to person place and time   EKG:  EKG is ordered today. 06/17/17: Sinus rhythm.  Rate 71 bpm.  Exercise Myoview 02/26/16: The left ventricular ejection fraction is hyperdynamic (>65%).  Nuclear stress EF: 72%.  There was no ST segment deviation noted during stress.  The study is normal.  This is a low risk study.   Normal exercise nuclear stress test with no evidence of prior infarct or ischemia.  Excellent exercise capacity. Normal BP response to stress.    Echo 04/09/16: Study Conclusions  - Left ventricle: The cavity size was normal. Wall thickness was   normal. Systolic function was normal. The estimated ejection   fraction was in the range of 60% to 65%. Wall motion was normal;   there were no regional wall motion abnormalities. Doppler   parameters are consistent with abnormal left ventricular   relaxation (grade 1 diastolic dysfunction). - Aortic valve: There was no stenosis. There was trivial   regurgitation. - Right ventricle: The cavity size was normal. Systolic function   was normal. - Tricuspid valve: Peak RV-RA gradient (S): 20 mm Hg. - Pulmonary arteries: PA peak pressure: 23 mm Hg (S). - Inferior vena cava: The vessel was normal in size. The   respirophasic diameter changes were  in the normal range (= 50%),   consistent with normal central venous pressure.  Impressions:  - Normal LV size and systolic function, EF 31-54%. Normal RV size   and systolic function. No significant valvular abnormalities.  Recent Labs: No results found for requested labs within last 8760 hours.   10/15/15: Sodium 137, potassium 4.4, BUN 23, creatinine 0.7 AST 19, ALT 20 WBC 8, hemoglobin 14.9, hematocrit 44.8, platelets 336 TSH 1.7  Lipid Panel    Component Value Date/Time   CHOL 241 (H) 11/26/2015 0824   TRIG 154 (H) 11/26/2015 0824   HDL 45 (L) 11/26/2015 0824   CHOLHDL 5.4 (H) 11/26/2015 0824   VLDL 31 (H) 11/26/2015 0824   LDLCALC 165 (H) 11/26/2015 0086  Wt Readings from Last 3 Encounters:  06/17/17 72.2 kg (159 lb 3.2 oz)  05/26/16 70.8 kg (156 lb)  03/30/16 70.9 kg (156 lb 6.4 oz)      ASSESSMENT AND PLAN:  # Fatigue:  Stress testing and echo were unremarkable.  She had minimal lung disease on PFTs and it has not improved with albuterol.  I do not get the sense that there is anything dangerous going on with her heart or lungs.  She is able to exercise quite a bit without symptoms.  I think that she sometimes overexerts herself.  No further testing at this time.  # Hyperlipidemia: ASCVD 10 year risk is 6%.  No statin.  We discussed trying fish oil if she is interested.  No coronary calcifications were noted on chest CT.  Current medicines are reviewed at length with the patient today.  The patient does not have concerns regarding medicines.  The following changes have been made:  no change  Labs/ tests ordered today include:   No orders of the defined types were placed in this encounter.    Disposition:   FU with Tiffany C. Oval Linsey, MD, [facility] as needed.    This note was written with the assistance of speech recognition software.  Please excuse any transcriptional errors.  Signed, Tiffany C. Oval Linsey, MD, [facility]  06/17/2017 3:35 PM    [facility]

## 2017-06-21 DIAGNOSIS — M25512 Pain in left shoulder: Secondary | ICD-10-CM | POA: Diagnosis not present

## 2017-06-21 DIAGNOSIS — M7542 Impingement syndrome of left shoulder: Secondary | ICD-10-CM | POA: Diagnosis not present

## 2017-06-23 DIAGNOSIS — M25512 Pain in left shoulder: Secondary | ICD-10-CM | POA: Diagnosis not present

## 2017-06-23 DIAGNOSIS — M7542 Impingement syndrome of left shoulder: Secondary | ICD-10-CM | POA: Diagnosis not present

## 2017-06-25 NOTE — Progress Notes (Signed)
Katherine Heath received her flu shot today at the [place] [facility]: 45809-983-38 Mfg: [employer] Exp: 02/13/18 RT deltoid

## 2017-06-28 DIAGNOSIS — M25512 Pain in left shoulder: Secondary | ICD-10-CM | POA: Diagnosis not present

## 2017-06-28 DIAGNOSIS — M7542 Impingement syndrome of left shoulder: Secondary | ICD-10-CM | POA: Diagnosis not present

## 2017-06-30 DIAGNOSIS — M25512 Pain in left shoulder: Secondary | ICD-10-CM | POA: Diagnosis not present

## 2017-06-30 DIAGNOSIS — M7542 Impingement syndrome of left shoulder: Secondary | ICD-10-CM | POA: Diagnosis not present

## 2017-07-12 DIAGNOSIS — M7542 Impingement syndrome of left shoulder: Secondary | ICD-10-CM | POA: Diagnosis not present

## 2017-07-12 DIAGNOSIS — M25512 Pain in left shoulder: Secondary | ICD-10-CM | POA: Diagnosis not present

## 2017-07-14 DIAGNOSIS — M25512 Pain in left shoulder: Secondary | ICD-10-CM | POA: Diagnosis not present

## 2017-07-14 DIAGNOSIS — M7542 Impingement syndrome of left shoulder: Secondary | ICD-10-CM | POA: Diagnosis not present

## 2017-08-05 DIAGNOSIS — M7542 Impingement syndrome of left shoulder: Secondary | ICD-10-CM | POA: Diagnosis not present

## 2017-08-05 DIAGNOSIS — M25512 Pain in left shoulder: Secondary | ICD-10-CM | POA: Diagnosis not present

## 2017-08-24 DIAGNOSIS — N951 Menopausal and female climacteric states: Secondary | ICD-10-CM | POA: Diagnosis not present

## 2017-08-24 DIAGNOSIS — Z79899 Other long term (current) drug therapy: Secondary | ICD-10-CM | POA: Diagnosis not present

## 2017-08-24 DIAGNOSIS — E781 Pure hyperglyceridemia: Secondary | ICD-10-CM | POA: Diagnosis not present

## 2017-08-24 DIAGNOSIS — N952 Postmenopausal atrophic vaginitis: Secondary | ICD-10-CM | POA: Diagnosis not present

## 2017-11-25 DIAGNOSIS — M25561 Pain in right knee: Secondary | ICD-10-CM | POA: Diagnosis not present

## 2017-11-25 DIAGNOSIS — M1711 Unilateral primary osteoarthritis, right knee: Secondary | ICD-10-CM | POA: Diagnosis not present

## 2018-03-01 DIAGNOSIS — M810 Age-related osteoporosis without current pathological fracture: Secondary | ICD-10-CM | POA: Diagnosis not present

## 2018-03-01 DIAGNOSIS — Z6825 Body mass index (BMI) 25.0-25.9, adult: Secondary | ICD-10-CM | POA: Diagnosis not present

## 2018-03-01 DIAGNOSIS — Z79899 Other long term (current) drug therapy: Secondary | ICD-10-CM | POA: Diagnosis not present

## 2018-03-01 DIAGNOSIS — N952 Postmenopausal atrophic vaginitis: Secondary | ICD-10-CM | POA: Diagnosis not present

## 2018-03-01 DIAGNOSIS — N951 Menopausal and female climacteric states: Secondary | ICD-10-CM | POA: Diagnosis not present

## 2018-03-01 LAB — BASIC METABOLIC PANEL
BUN: 20 (ref 4–21)
Creatinine: 0.9 (ref 0.5–1.1)
Glucose: 89
Potassium: 4.5 (ref 3.4–5.3)
Sodium: 141 (ref 137–147)

## 2018-03-01 LAB — VITAMIN B12: Vitamin B-12: 340

## 2018-03-22 ENCOUNTER — Other Ambulatory Visit: Payer: Self-pay | Admitting: Internal Medicine

## 2018-03-22 DIAGNOSIS — M810 Age-related osteoporosis without current pathological fracture: Secondary | ICD-10-CM

## 2018-04-21 DIAGNOSIS — Z1231 Encounter for screening mammogram for malignant neoplasm of breast: Secondary | ICD-10-CM | POA: Diagnosis not present

## 2018-04-21 DIAGNOSIS — Z0000 Encounter for general adult medical examination without abnormal findings: Secondary | ICD-10-CM | POA: Diagnosis not present

## 2018-05-06 ENCOUNTER — Other Ambulatory Visit: Payer: Self-pay | Admitting: Internal Medicine

## 2018-05-06 DIAGNOSIS — Z1231 Encounter for screening mammogram for malignant neoplasm of breast: Secondary | ICD-10-CM

## 2018-05-11 DIAGNOSIS — H2513 Age-related nuclear cataract, bilateral: Secondary | ICD-10-CM | POA: Diagnosis not present

## 2018-05-11 DIAGNOSIS — H04123 Dry eye syndrome of bilateral lacrimal glands: Secondary | ICD-10-CM | POA: Diagnosis not present

## 2018-05-25 ENCOUNTER — Encounter: Payer: Self-pay | Admitting: Internal Medicine

## 2018-05-26 ENCOUNTER — Ambulatory Visit (INDEPENDENT_AMBULATORY_CARE_PROVIDER_SITE_OTHER): Payer: Medicare Other | Admitting: Internal Medicine

## 2018-05-26 ENCOUNTER — Encounter: Payer: Self-pay | Admitting: Internal Medicine

## 2018-05-26 VITALS — BP 126/78 | HR 62 | Temp 98.0°F | Ht 62.25 in | Wt 142.4 lb

## 2018-05-26 DIAGNOSIS — Z23 Encounter for immunization: Secondary | ICD-10-CM

## 2018-05-26 DIAGNOSIS — N951 Menopausal and female climacteric states: Secondary | ICD-10-CM

## 2018-05-26 MED ORDER — PNEUMOCOCCAL 13-VAL CONJ VACC IM SUSP: 0.5000 mL | INTRAMUSCULAR | 0 refills | Status: AC

## 2018-05-26 NOTE — Progress Notes (Signed)
+-    Subjective:     Patient ID: Katherine Heath , female    DOB: [birth year] , 67 y.o.   MRN: [id number]   SHE IS HERE TODAY FOR F/U BHRT.  SHE IS DOING WELL ON HER CURRENT REGIMEN. SHE REPORTS COMPLIANCE WITH HER REGIMEN.     Past Medical History:  Diagnosis Date  . Arthritis   . Depression   . GERD (gastroesophageal reflux disease)   . Hyperlipidemia 06/17/2017  . Shortness of breath 11/23/2015      Current Outpatient Medications:  .  Calcium Carbonate-Vit D-Min (CALCIUM 1200 PO), Take by mouth., Disp: , Rfl:  .  Cholecalciferol (VITAMIN D-3 PO), Take by mouth daily. 6000 iu daily, Disp: , Rfl:  .  Estriol POWD, by Does not apply route 3 (three) times a week., Disp: , Rfl:  .  magnesium 30 MG tablet, Take 30 mg by mouth daily., Disp: , Rfl:  .  PROGESTERONE, VAGINAL, 4 % GEL, Place 1 applicator vaginally 3 (three) times a week. , Disp: , Rfl:  .  TURMERIC PO, Take by mouth., Disp: , Rfl:  .  pneumococcal 13-valent conjugate vaccine (PREVNAR 13) SUSP injection, Inject 0.5 mLs into the muscle tomorrow at 10 am for 1 dose., Disp: 0.5 mL, Rfl: 0   Review of Systems  Constitutional: Negative.   HENT: Negative.   Eyes: Negative.   Respiratory: Negative.   Cardiovascular: Negative.   Gastrointestinal: Negative.   Psychiatric/Behavioral: Negative.      Today's Vitals   05/26/18 1445  BP: 126/78  Pulse: 62  Temp: 98 F (36.7 C)  TempSrc: Oral  Weight: 142 lb 6.4 oz (64.6 kg)  Height: 5' 2.25" (1.581 m)  PainSc: 0-No pain   Body mass index is 25.84 kg/m.   Objective:  Physical Exam  Constitutional: She appears well-developed and well-nourished.  HENT:  Head: Normocephalic and atraumatic.  Eyes: EOM are normal.  Cardiovascular: Normal rate, regular rhythm and normal heart sounds.  Pulmonary/Chest: Effort normal and breath sounds normal.  Skin: Skin is warm and dry.  Psychiatric: She has a normal mood and affect.  Nursing note and vitals reviewed.       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 SHE WILL CONTINUE WITH HER CURRENT BHRT REGIMEN. SHE WILL RTO IN  4 MONTHS FOR RE-EVALUATION.   Need for immunization against influenza - SHE WAS GIVEN HIGH DOSE FLU VACCINE.  - Plan: Flu vaccine HIGH DOSE PF (Fluzone High dose)  Need for prophylactic vaccination against Streptococcus pneumoniae (pneumococcus) and influenza - DRX PREVNAR-13 WAS SENT TO THE PHARMACY.     [name], MD

## 2018-06-07 DIAGNOSIS — Z23 Encounter for immunization: Secondary | ICD-10-CM | POA: Diagnosis not present

## 2018-08-02 ENCOUNTER — Encounter: Payer: Self-pay | Admitting: Internal Medicine

## 2018-09-29 ENCOUNTER — Encounter: Payer: Self-pay | Admitting: Internal Medicine

## 2018-09-29 ENCOUNTER — Ambulatory Visit (INDEPENDENT_AMBULATORY_CARE_PROVIDER_SITE_OTHER): Payer: Medicare Other | Admitting: Internal Medicine

## 2018-09-29 VITALS — BP 114/62 | HR 68 | Temp 97.9°F | Ht 62.8 in | Wt 141.8 lb

## 2018-09-29 DIAGNOSIS — E2839 Other primary ovarian failure: Secondary | ICD-10-CM

## 2018-09-29 DIAGNOSIS — Z1231 Encounter for screening mammogram for malignant neoplasm of breast: Secondary | ICD-10-CM | POA: Diagnosis not present

## 2018-09-29 DIAGNOSIS — M25552 Pain in left hip: Secondary | ICD-10-CM | POA: Diagnosis not present

## 2018-09-29 DIAGNOSIS — Z79899 Other long term (current) drug therapy: Secondary | ICD-10-CM | POA: Diagnosis not present

## 2018-09-29 DIAGNOSIS — N951 Menopausal and female climacteric states: Secondary | ICD-10-CM | POA: Diagnosis not present

## 2018-09-29 NOTE — Patient Instructions (Signed)
Piriformis Syndrome    Piriformis syndrome is a condition that can cause pain and numbness in your buttocks and down the back of your leg. Piriformis syndrome happens when the small muscle that connects the base of your spine to your hip (piriformis muscle) presses on the nerve that runs down the back of your leg (sciatic nerve).  The piriformis muscle helps your hip rotate and helps to bring your leg back and out. It also helps shift your weight while you are walking to keep you stable. The sciatic nerve runs under or through the piriformis. Damage to the piriformis muscle can cause spasms that put pressure on the nerve below. This causes pain and discomfort while sitting and moving. The pain may feel as if it begins in the buttock and spreads (radiates) down your hip and thigh.  What are the causes?  This condition is caused by pressure on the sciatic nerve from the piriformis muscle. The piriformis muscle can get irritated with overuse, especially if other hip muscles are weak and the piriformis has to do extra work. Piriformis syndrome can also occur after an injury, like a fall onto your buttocks.  What increases the risk?  This condition is more likely to develop in:  · Women.  · People who sit for long periods of time.  · Cyclists.  · People who have weak buttocks muscles (gluteal muscles).  What are the signs or symptoms?  Pain, tingling, or numbness that starts in the buttock and runs down the back of your leg (sciatica) is the most common symptom of this condition. Your symptoms may:  · Get worse the longer you sit.  · Get worse when you walk, run, or go up on stairs.  How is this diagnosed?  This condition is diagnosed based on your symptoms, medical history, and physical exam. During this exam, your health care provider may move your leg into different positions to check for pain. He or she will also press on the muscles of your hip and buttock to see if that increases your symptoms. You may also have an  X-ray or MRI.  How is this treated?  Treatment for this condition may include:  · Stopping all activities that cause pain or make your condition worse.  · Using heat or ice to relieve pain as told by your health care provider.  · Taking medicines to reduce pain and swelling.  · Taking a muscle relaxer to release the piriformis muscle.  · Doing range-of-motion and strengthening exercises (physical therapy) as told by your health care provider.  · Massaging the affected area.  · Getting an injection of an anti-inflammatory medicine or muscle relaxer to reduce inflammation and muscle tension.  In rare cases, you may need surgery to cut the muscle and release pressure on the nerve if other treatments do not work.  Follow these instructions at home:  · Take over-the-counter and prescription medicines only as told by your health care provider.  · Do not sit for long periods. Get up and walk around every 20 minutes or as often as told by your health care provider.  · If directed, apply heat to the affected area as often as told by your health care provider. Use the heat source that your health care provider recommends, such as a moist heat pack or a heating pad.  ? Place a towel between your skin and the heat source.  ? Leave the heat on for 20-30 minutes.  ? Remove the   heat if your skin turns bright red. This is especially important if you are unable to feel pain, heat, or cold. You may have a greater risk of getting burned.  · If directed, apply ice to the injured area.  ? Put ice in a plastic bag.  ? Place a towel between your skin and the bag.  ? Leave the ice on for 20 minutes, 2-3 times a day.  · Do exercises as told by your health care provider.  · Return to your normal activities as told by your health care provider. Ask your health care provider what activities are safe for you.  · Keep all follow-up visits as told by your health care provider. This is important.  How is this prevented?  · Do not sit for longer  than 20 minutes at a time. When you sit, choose padded surfaces.  · Warm up and stretch before being active.  · Cool down and stretch after being active.  · Give your body time to rest between periods of activity.  · Make sure to use equipment that fits you.  · Maintain physical fitness, including:  ? Strength.  ? Flexibility.  Contact a health care provider if:  · Your pain and stiffness continue or get worse.  · Your leg or hip becomes weak.  · You have changes in your bowel function or bladder function.  This information is not intended to replace advice given to you by your health care provider. Make sure you discuss any questions you have with your health care provider.  Document Released: 08/03/2005 Document Revised: 04/07/2016 Document Reviewed: 07/16/2015  Elsevier Interactive Patient Education © 2019 Elsevier Inc.

## 2018-09-30 LAB — CMP14+EGFR
ALT: 12 IU/L (ref 0–32)
AST: 21 IU/L (ref 0–40)
Albumin/Globulin Ratio: 1.5 (ref 1.2–2.2)
Albumin: 4 g/dL (ref 3.8–4.8)
Alkaline Phosphatase: 72 IU/L (ref 39–117)
BUN/Creatinine Ratio: 25 (ref 12–28)
BUN: 23 mg/dL (ref 8–27)
Bilirubin Total: <0.2 mg/dL (ref 0.0–1.2)
CO2: 25 mmol/L (ref 20–29)
Calcium: 9.1 mg/dL (ref 8.7–10.3)
Chloride: 103 mmol/L (ref 96–106)
Creatinine, Ser: 0.92 mg/dL (ref 0.57–1.00)
GFR calc Af Amer: 75 mL/min/{1.73_m2} (ref >59)
GFR calc non Af Amer: 65 mL/min/{1.73_m2} (ref >59)
Globulin, Total: 2.6 g/dL (ref 1.5–4.5)
Glucose: 103 mg/dL — ABNORMAL HIGH (ref 65–99)
Potassium: 4.6 mmol/L (ref 3.5–5.2)
Sodium: 140 mmol/L (ref 134–144)
Total Protein: 6.6 g/dL (ref 6.0–8.5)

## 2018-10-12 NOTE — Progress Notes (Signed)
Subjective:     Patient ID: Katherine Heath , female    DOB: Jan 05, 1951 , 68 y.o.   MRN: [id number]   Chief Complaint  Patient presents with  . hormones f/u    HPI  She is here today for f/u BHRT.  She reports compliance with her current regimen. She is ready to wean off of her HRt completely.     Past Medical History:  Diagnosis Date  . Arthritis   . Depression   . GERD (gastroesophageal reflux disease)   . Hyperlipidemia 06/17/2017  . Shortness of breath 11/23/2015     Family History  Problem Relation Age of Onset  . Heart disease Father   . Heart disease Maternal Grandmother   . Hypertension Maternal Grandmother   . Heart disease Paternal Grandfather   . Stroke Maternal Grandfather      Current Outpatient Medications:  .  Calcium Carbonate-Vit D-Min (CALCIUM 1200 PO), Take by mouth., Disp: , Rfl:  .  Cholecalciferol (VITAMIN D-3 PO), Take by mouth daily. 6000 iu daily, Disp: , Rfl:  .  Estriol POWD, by Does not apply route 3 (three) times a week., Disp: , Rfl:  .  magnesium 30 MG tablet, Take 30 mg by mouth daily., Disp: , Rfl:  .  Progesterone Micronized (PROGESTERONE PO), Take by mouth., Disp: , Rfl:  .  TURMERIC PO, Take by mouth., Disp: , Rfl:  .  VITAMIN K PO, Take by mouth., Disp: , Rfl:    Allergies  Allergen Reactions  . Latex Itching     Review of Systems  Constitutional: Negative.   Respiratory: Negative.   Cardiovascular: Negative.   Gastrointestinal: Negative.   Musculoskeletal: Positive for arthralgias (she c/o l hip pain).  Neurological: Negative.   Psychiatric/Behavioral: Negative.      Today's Vitals   09/29/18 1413  BP: 114/62  Pulse: 68  Temp: 97.9 F (36.6 C)  TempSrc: Oral  Weight: 141 lb 12.8 oz (64.3 kg)  Height: 5' 2.8" (1.595 m)  PainSc: 0-No pain   Body mass index is 25.28 kg/m.   Objective:  Physical Exam Vitals signs and nursing note reviewed.  Constitutional:      Appearance: Normal appearance.  HENT:     Head:  Normocephalic and atraumatic.  Cardiovascular:     Rate and Rhythm: Normal rate and regular rhythm.     Heart sounds: Normal heart sounds.  Pulmonary:     Effort: Pulmonary effort is normal.     Breath sounds: Normal breath sounds.  Musculoskeletal:        General: Tenderness present.     Comments: She has left hip tenderness. Some pain with full ROM  Skin:    General: Skin is warm.  Neurological:     General: No focal deficit present.     Mental Status: She is alert.  Psychiatric:        Mood and Affect: Mood normal.        Behavior: Behavior normal.         Assessment And Plan:     1. Female climacteric state  Starting next week, she will go to estriol vaginal cream twice weekly for next two months, then once weekly. She is agreeable with her current treatment plan. Our hope is that she will be able to wean off of this completely.   2. Estrogen deficiency  [facility]   3. Left hip pain  Possible piriformis syndrome. She  is encouraged to consider chiropractic evaluation.   4. Breast cancer screening  I will schedule her for annual mammogram.   5. Drug therapy  - CMP14+EGFR        Robyn N Sanders, MD  

## 2018-12-14 ENCOUNTER — Other Ambulatory Visit: Payer: Self-pay

## 2018-12-14 ENCOUNTER — Encounter: Payer: Self-pay | Admitting: Emergency Medicine

## 2018-12-14 ENCOUNTER — Ambulatory Visit
Admission: EM | Admit: 2018-12-14 | Discharge: 2018-12-14 | Disposition: A | Discharge status: Home / Self Care | Payer: Medicare Other | Attending: Family Medicine | Admitting: Family Medicine

## 2018-12-14 DIAGNOSIS — W540XXA Bitten by dog, initial encounter: Secondary | ICD-10-CM | POA: Diagnosis not present

## 2018-12-14 DIAGNOSIS — S60572A Other superficial bite of hand of left hand, initial encounter: Secondary | ICD-10-CM | POA: Diagnosis not present

## 2018-12-14 MED ORDER — AMOXICILLIN-POT CLAVULANATE 875-125 MG PO TABS: 1.0000 | ORAL_TABLET | Freq: Two times a day (BID) | ORAL | 0 refills | Status: DC

## 2018-12-14 MED ORDER — TETANUS-DIPHTH-ACELL PERTUSSIS 5-2.5-18.5 LF-MCG/0.5 IM SUSP
0.5000 mL | Freq: Once | INTRAMUSCULAR | Status: AC
Start: 2018-12-14 — End: 2018-12-14
  Administered 2018-12-14: 0.5 mL via INTRAMUSCULAR

## 2018-12-14 NOTE — ED Provider Notes (Signed)
[facility]    CSN: [id number] Arrival date & time: 12/14/18  1103     History   Chief Complaint Chief Complaint  Patient presents with  . Animal Bite    HPI Katherine Heath is a 68 y.o. female.   Patient is a 68 year old female that presents today with dog bite to the left hand.  This occurred this morning.  Reports that this was her dog.  Occurred while trying to break up her dog and another dog.  Dog is up-to-date on rabies.  Patient is unsure about her tetanus.  She is still having mild bleeding from the area.  Denies any numbness, tingling or loss of sensation.  Denies any decrease in range of motion.  No fevers.  ROS per HPI      Past Medical History:  Diagnosis Date  . Arthritis   . Depression   . GERD (gastroesophageal reflux disease)   . Hyperlipidemia 06/17/2017  . Shortness of breath 11/23/2015    Patient Active Problem List   Diagnosis Date Noted  . Hyperlipidemia 06/17/2017  . Exhaustion 05/26/2016  . Shortness of breath 11/23/2015  . Other fatigue 11/23/2015    Past Surgical History:  Procedure Laterality Date  . sphincterotomy  2003    OB History   No obstetric history on file.      Home Medications    Prior to Admission medications   Medication Sig Start Date End Date Taking? Authorizing Provider  amoxicillin-clavulanate (AUGMENTIN) 875-125 MG tablet Take 1 tablet by mouth every 12 (twelve) hours. 12/14/18   Loura Halt A, NP  Calcium Carbonate-Vit D-Min (CALCIUM 1200 PO) Take by mouth.    [provider]  Cholecalciferol (VITAMIN D-3 PO) Take by mouth daily. 6000 iu daily    [provider]  Estriol POWD by Does not apply route 3 (three) times a week.    [provider]  magnesium 30 MG tablet Take 30 mg by mouth daily.    [provider]  Progesterone Micronized (PROGESTERONE PO) Take by mouth.    [provider]  TURMERIC PO Take by mouth.    [provider]  VITAMIN K PO  Take by mouth.    [provider]    Family History Family History  Problem Relation Age of Onset  . Heart disease Father   . Heart disease Maternal Grandmother   . Hypertension Maternal Grandmother   . Heart disease Paternal Grandfather   . Stroke Maternal Grandfather     Social History Social History   Tobacco Use  . Smoking status: Never Smoker  . Smokeless tobacco: Never Used  Substance Use Topics  . Alcohol use: Yes    Alcohol/week: 0.0 standard drinks  . Drug use: No     Allergies   Latex   Review of Systems Review of Systems   Physical Exam Triage Vital Signs ED Triage Vitals [12/14/18 1109]  Enc Vitals Group     BP (!) 142/85     Pulse Rate 67     Resp 18     Temp 98 F (36.7 C)     Temp Source Oral     SpO2 95 %     Weight      Height      Head Circumference      Peak Flow      Pain Score 3     Pain Loc      Pain Edu?      Excl.  in [place]?    No data found.  Updated Vital Signs BP (!) 142/85 (BP Location: Left Arm)   Pulse 67   Temp 98 F (36.7 C) (Oral)   Resp 18   SpO2 95%   Visual Acuity Right Eye Distance:   Left Eye Distance:   Bilateral Distance:    Right Eye Near:   Left Eye Near:    Bilateral Near:     Physical Exam Vitals signs and nursing note reviewed.  Constitutional:      Appearance: Normal appearance.  HENT:     Head: Normocephalic and atraumatic.     Nose: Nose normal.  Eyes:     Conjunctiva/sclera: Conjunctivae normal.  Neck:     Musculoskeletal: Normal range of motion.  Pulmonary:     Effort: Pulmonary effort is normal.  Musculoskeletal: Normal range of motion.        General: Tenderness and signs of injury present.     Comments: Good flexion and extension of the thumb and index finger.    Skin:    General: Skin is warm and dry.     Comments: See picture for detail of dog bite  Neurological:     Mental Status: She is alert.  Psychiatric:        Mood and Affect: Mood normal.        UC  Treatments / Results  Labs (all labs ordered are listed, but only abnormal results are displayed) Labs Reviewed - No data to display  EKG None  Radiology No results found.  Procedures Procedures (including critical care time)  Medications Ordered in UC Medications  Tdap (BOOSTRIX) injection 0.5 mL (0.5 mLs Intramuscular Given 12/14/18 1131)    Initial Impression / Assessment and Plan / UC Course  I have reviewed the triage vital signs and the nursing notes.  Pertinent labs & imaging results that were available during my care of the patient were reviewed by me and considered in my medical decision making (see chart for details).    Dog bite to the right hand   Pt with dog bite to the right hand from her own dog Dog is up to date on rabies No concern for tendon involvement.  Cleaned wound in clinic and placed dressing Updated tetanus Prescribed Augmentin for antibiotic coverage Strict precautions and instructions to monitor her wound for the next 24 to 48 hours.  If she develops any severe redness, swelling or drainage she needs to follow-up for recheck Final Clinical Impressions(s) / UC Diagnoses   Final diagnoses:  Dog bite, initial encounter     Discharge Instructions     We have cleaned the wound and dressed.  Make sure you keep the area clean and dry. I am sending some antibiotics to the pharmacy.  Take these as prescribed. Tetanus shot updated here in clinic. Please keep a close watch over the wound for the next 24 to 48 hours. If you start experiencing any severe redness, swelling or drainage from the area please be seen for reevaluation There is no concern for heart murmur transmission.  This is only possible to mosquito contact    ED Prescriptions    Medication Sig Dispense Auth. Provider   amoxicillin-clavulanate (AUGMENTIN) 875-125 MG tablet Take 1 tablet by mouth every 12 (twelve) hours. 14 tablet Loura Halt A, NP     Controlled Substance  Prescriptions [place] Controlled Substance Registry consulted? Not Applicable   Orvan July, NP 12/14/18 1351

## 2018-12-14 NOTE — ED Triage Notes (Signed)
Pt presents to [facility] for assessment of dog bite to left hand, happened approx 930am.  States dog is up to date on rabies.

## 2018-12-14 NOTE — Discharge Instructions (Addendum)
We have cleaned the wound and dressed.  Make sure you keep the area clean and dry. I am sending some antibiotics to the pharmacy.  Take these as prescribed. Tetanus shot updated here in clinic. Please keep a close watch over the wound for the next 24 to 48 hours. If you start experiencing any severe redness, swelling or drainage from the area please be seen for reevaluation There is no concern for heart murmur transmission.  This is only possible to mosquito contact

## 2018-12-14 NOTE — ED Notes (Signed)
Patient able to ambulate independently  

## 2018-12-14 NOTE — ED Notes (Signed)
Wound cleaned with wound cleaner, dried and covered with bacitracin and non-adherent dressing.

## 2019-01-31 ENCOUNTER — Encounter: Payer: Self-pay | Admitting: Internal Medicine

## 2019-01-31 ENCOUNTER — Other Ambulatory Visit: Payer: Self-pay

## 2019-01-31 ENCOUNTER — Ambulatory Visit (INDEPENDENT_AMBULATORY_CARE_PROVIDER_SITE_OTHER): Payer: Medicare Other | Admitting: Internal Medicine

## 2019-01-31 VITALS — BP 101/67 | HR 64 | Temp 97.8°F | Ht 62.8 in | Wt 136.4 lb

## 2019-01-31 DIAGNOSIS — L299 Pruritus, unspecified: Secondary | ICD-10-CM | POA: Diagnosis not present

## 2019-01-31 DIAGNOSIS — R202 Paresthesia of skin: Secondary | ICD-10-CM

## 2019-01-31 DIAGNOSIS — Z79899 Other long term (current) drug therapy: Secondary | ICD-10-CM

## 2019-01-31 DIAGNOSIS — N951 Menopausal and female climacteric states: Secondary | ICD-10-CM | POA: Diagnosis not present

## 2019-01-31 NOTE — Patient Instructions (Signed)
Pruritus  Pruritus is an itchy feeling on the skin. One of the most common causes is dry skin, but many different things can cause itching. Most cases of itching do not require medical attention. Sometimes itchy skin can turn into a rash.  Follow these instructions at home:  Skin care     Apply moisturizing lotion to your skin as needed. Lotion that contains petroleum jelly is best.   Take medicines or apply medicated creams only as told by your health care provider. This may include:  ? Corticosteroid cream.  ? Anti-itch lotions.  ? Oral antihistamines.   Apply a cool, wet cloth (cool compress) to the affected areas.   Take baths with one of the following:  ? Epsom salts. You can get these at your local pharmacy or grocery store. Follow the instructions on the packaging.  ? Baking soda. Pour a small amount into the bath as told by your health care provider.  ? Colloidal oatmeal. You can get this at your local pharmacy or grocery store. Follow the instructions on the packaging.   Apply baking soda paste to your skin. To make the paste, stir water into a small amount of baking soda until it reaches a paste-like consistency.   Do not scratch your skin.   Do not take hot showers or baths, which can make itching worse. A cool shower may help with itching as long as you apply moisturizing lotion after the shower.   Do not use scented soaps, detergents, perfumes, and cosmetic products. Instead, use gentle, unscented versions of these items.  General instructions   Avoid wearing tight clothes.   Keep a journal to help find out what is causing your itching. Write down:  ? What you eat and drink.  ? What cosmetic products you use.  ? What soaps or detergents you use.  ? What you wear, including jewelry.   Use a humidifier. This keeps the air moist, which helps to prevent dry skin.   Be aware of any changes in your itchiness.  Contact a health care provider if:   The itching does not go away after several  days.   You are unusually thirsty or urinating more than normal.   Your skin tingles or feels numb.   Your skin or the white parts of your eyes turn yellow (jaundice).   You feel weak.   You have any of the following:  ? Night sweats.  ? Tiredness (fatigue).  ? Weight loss.  ? Abdominal pain.  Summary   Pruritus is an itchy feeling on the skin. One of the most common causes is dry skin, but many different conditions and factors can cause itching.   Apply moisturizing lotion to your skin as needed. Lotion that contains petroleum jelly is best.   Take medicines or apply medicated creams only as told by your health care provider.   Do not take hot showers or baths. Do not use scented soaps, detergents, perfumes, or cosmetic products.  This information is not intended to replace advice given to you by your health care provider. Make sure you discuss any questions you have with your health care provider.  Document Released: 04/15/2011 Document Revised: 08/17/2017 Document Reviewed: 08/17/2017  Elsevier Interactive Patient Education  2019 Elsevier Inc.

## 2019-02-19 NOTE — Progress Notes (Signed)
Virtual Visit via Video   This visit type was conducted due to national recommendations for restrictions regarding the COVID-19 Pandemic (e.g. social distancing) in an effort to limit this patient's exposure and mitigate transmission in our community.  Due to her co-morbid illnesses, this patient is at least at moderate risk for complications without adequate follow up.  This format is felt to be most appropriate for this patient at this time.  All issues noted in this document were discussed and addressed.  A limited physical exam was performed with this format.    This visit type was conducted due to national recommendations for restrictions regarding the COVID-19 Pandemic (e.g. social distancing) in an effort to limit this patient's exposure and mitigate transmission in our community.  Patients identity confirmed using two different identifiers.  This format is felt to be most appropriate for this patient at this time.  All issues noted in this document were discussed and addressed.  No physical exam was performed (except for noted visual exam findings with Video Visits).    Date:  02/19/2019   ID:  Katherine Heath, DOB Oct 26, 1950, MRN [id number]  Patient Location:  Home  Provider location:   Office    Chief Complaint:  I need BHRT f/u  History of Present Illness:    Katherine Heath is a 68 y.o. female who presents via video conferencing for a telehealth visit today.    The patient does not have symptoms concerning for COVID-19 infection (fever, chills, cough, or new shortness of breath).   She presents today for virtual visit. She prefers this method of contact due to COVID-19 pandemic.  She presents today for f/u BHRT.  She reports she ran out of the estriol vaginal cream about a month ago. She no longer wants to use this. She has since developed some hot flashes. Unable to determine what triggers her symptoms. She is not having any vaginal dryness. She is not having any issues with  sleep.     Past Medical History:  Diagnosis Date  . Arthritis   . Depression   . GERD (gastroesophageal reflux disease)   . Hyperlipidemia 06/17/2017  . Shortness of breath 11/23/2015   Past Surgical History:  Procedure Laterality Date  . sphincterotomy  2003     Current Meds  Medication Sig  . Calcium Carbonate-Vit D-Min (CALCIUM 1200 PO) Take by mouth.  . Cholecalciferol (VITAMIN D-3 PO) Take by mouth daily. 6000 iu daily  . Progesterone Micronized (PROGESTERONE PO) Take 100 mg by mouth.   . TURMERIC PO Take by mouth.  Marland Kitchen VITAMIN K PO Take by mouth.  . [DISCONTINUED] magnesium 30 MG tablet Take 30 mg by mouth daily.     Allergies:   Latex   Social History   Tobacco Use  . Smoking status: Never Smoker  . Smokeless tobacco: Never Used  Substance Use Topics  . Alcohol use: Yes    Alcohol/week: 0.0 standard drinks  . Drug use: No     Family Hx: The patient's family history includes Heart disease in her father, maternal grandmother, and paternal grandfather; Hypertension in her maternal grandmother; Stroke in her maternal grandfather.  ROS:   Please see the history of present illness.    Review of Systems  Constitutional: Negative.   Respiratory: Negative.   Cardiovascular: Negative.   Gastrointestinal: Negative.   Neurological: Positive for tingling.       She also has left leg tingling. She is not sure what triggers her sx. She  denies LE weakness.   Psychiatric/Behavioral: Negative.   She does admit to itching. She reports her back itches so bad, she has stopped wearing a bra b/c that aggravated her symptoms. Her sx are most noticeable upon awakening.    All other systems reviewed and are negative.   Labs/Other Tests and Data Reviewed:    Recent Labs: 09/29/2018: ALT 12; BUN 23; Creatinine, Ser 0.92; Potassium 4.6; Sodium 140   Recent Lipid Panel Lab Results  Component Value Date/Time   CHOL 241 (H) 11/26/2015 08:24 AM   TRIG 154 (H) 11/26/2015 08:24 AM    HDL 45 (L) 11/26/2015 08:24 AM   CHOLHDL 5.4 (H) 11/26/2015 08:24 AM   LDLCALC 165 (H) 11/26/2015 08:24 AM    Wt Readings from Last 3 Encounters:  01/31/19 136 lb 6.4 oz (61.9 kg)  09/29/18 141 lb 12.8 oz (64.3 kg)  05/26/18 142 lb 6.4 oz (64.6 kg)     Exam:    Vital Signs:  BP 101/67 (BP Location: Left Arm, Patient Position: Sitting, Cuff Size: Normal) Comment: pt provided  Pulse 64 Comment: pt provided  Temp 97.8 F (36.6 C) (Oral) Comment: pt provided  Ht 5' 2.8" (1.595 m)   Wt 136 lb 6.4 oz (61.9 kg) Comment: pt provided  BMI 24.32 kg/m     Physical Exam  Constitutional: She is oriented to person, place, and time and well-developed, well-nourished, and in no distress.  HENT:  Head: Normocephalic and atraumatic.  Pulmonary/Chest: Effort normal.  Neurological: She is alert and oriented to person, place, and time.  Psychiatric: Affect normal.  Nursing note and vitals reviewed.   ASSESSMENT & PLAN:     1. Female climacteric state  She will remain off of estriol vaginal cream as per her request. This is likely contributing to her hot flashes. I will call in refill of progesterone SR capsules, 50mg  nightly except Sundays.   2. Pruritus and related conditions  She is encouraged to pay attention to affected area - look for development of a rash. I will check liver function when she comes in for bloodwork. Most recent LFTS from 2/20 were reviewed in full detail - they were normal. I will check a CBC as well to evaluate for anemia.   3. Paresthesia  I will check vitamin B12 level when she comes in for bloodwork. She will let me know if her sx persist. She is also encouraged to pay attention what triggers her symptoms.     COVID-19 Education: The signs and symptoms of COVID-19 were discussed with the patient and how to seek care for testing (follow up with PCP or arrange E-visit).  The importance of social distancing was discussed today.  Patient Risk:   After full review  of this patients clinical status, I feel that they are at least moderate risk at this time.  Time:   Today, I have spent 20 minutes/ seconds with the patient with telehealth technology discussing above diagnoses.     Medication Adjustments/Labs and Tests Ordered: Current medicines are reviewed at length with the patient today.  Concerns regarding medicines are outlined above.   Tests Ordered: Orders Placed This Encounter  Procedures  . Vitamin B12    Medication Changes: No orders of the defined types were placed in this encounter.   Disposition:  Follow up prn  Signed, [name], MD

## 2019-04-05 ENCOUNTER — Other Ambulatory Visit: Payer: Self-pay

## 2019-04-05 ENCOUNTER — Ambulatory Visit
Admission: RE | Admit: 2019-04-05 | Discharge: 2019-04-05 | Disposition: A | Discharge status: Home / Self Care | Payer: Medicare Other | Source: Ambulatory Visit | Attending: Internal Medicine | Admitting: Internal Medicine

## 2019-04-05 DIAGNOSIS — Z1231 Encounter for screening mammogram for malignant neoplasm of breast: Secondary | ICD-10-CM | POA: Diagnosis not present

## 2019-04-05 DIAGNOSIS — M810 Age-related osteoporosis without current pathological fracture: Secondary | ICD-10-CM

## 2019-04-26 ENCOUNTER — Ambulatory Visit: Payer: No Typology Code available for payment source | Admitting: Internal Medicine

## 2019-04-26 ENCOUNTER — Ambulatory Visit: Payer: Medicare Other

## 2019-05-16 ENCOUNTER — Ambulatory Visit (INDEPENDENT_AMBULATORY_CARE_PROVIDER_SITE_OTHER): Payer: Medicare Other

## 2019-05-16 ENCOUNTER — Other Ambulatory Visit: Payer: Self-pay

## 2019-05-16 VITALS — BP 110/73 | HR 73 | Temp 98.1°F | Ht 62.5 in | Wt 135.7 lb

## 2019-05-16 DIAGNOSIS — Z0000 Encounter for general adult medical examination without abnormal findings: Secondary | ICD-10-CM

## 2019-05-16 NOTE — Patient Instructions (Signed)
Katherine Heath , Thank you for taking time to come for your Medicare Wellness Visit. I appreciate your ongoing commitment to your health goals. Please review the following plan we discussed and let me know if I can assist you in the future.   Screening recommendations/referrals: Colonoscopy: 3-4 years ago per patient Mammogram: 03/2019 Bone Density: 03/2019 Recommended yearly ophthalmology/optometry visit for glaucoma screening and checkup Recommended yearly dental visit for hygiene and checkup  Vaccinations: Influenza vaccine: 05/2018 Pneumococcal vaccine: 05/2018 Tdap vaccine: 11/2018 Shingles vaccine: discussed    Advanced directives: Please bring a copy of your POA (Power of [place]) and/or Living Will to your next appointment.    Conditions/risks identified: hyperlipidemia  Next appointment: 06/06/2019 at 2:45   Preventive Care 24 Years and Older, Female Preventive care refers to lifestyle choices and visits with your health care provider that can promote health and wellness. What does preventive care include?  A yearly physical exam. This is also called an annual well check.  Dental exams once or twice a year.  Routine eye exams. Ask your health care provider how often you should have your eyes checked.  Personal lifestyle choices, including:  Daily care of your teeth and gums.  Regular physical activity.  Eating a healthy diet.  Avoiding tobacco and drug use.  Limiting alcohol use.  Practicing safe sex.  Taking low-dose aspirin every day.  Taking vitamin and mineral supplements as recommended by your health care provider. What happens during an annual well check? The services and screenings done by your health care provider during your annual well check will depend on your age, overall health, lifestyle risk factors, and family history of disease. Counseling  Your health care provider may ask you questions about your:  Alcohol use.  Tobacco use.  Drug use.   Emotional well-being.  Home and relationship well-being.  Sexual activity.  Eating habits.  History of falls.  Memory and ability to understand (cognition).  Work and work Statistician.  Reproductive health. Screening  You may have the following tests or measurements:  Height, weight, and BMI.  Blood pressure.  Lipid and cholesterol levels. These may be checked every 5 years, or more frequently if you are over 60 years old.  Skin check.  Lung cancer screening. You may have this screening every year starting at age 72 if you have a 30-pack-year history of smoking and currently smoke or have quit within the past 15 years.  Fecal occult blood test (FOBT) of the stool. You may have this test every year starting at age 51.  Flexible sigmoidoscopy or colonoscopy. You may have a sigmoidoscopy every 5 years or a colonoscopy every 10 years starting at age 29.  Hepatitis C blood test.  Hepatitis B blood test.  Sexually transmitted disease (STD) testing.  Diabetes screening. This is done by checking your blood sugar (glucose) after you have not eaten for a while (fasting). You may have this done every 1-3 years.  Bone density scan. This is done to screen for osteoporosis. You may have this done starting at age 48.  Mammogram. This may be done every 1-2 years. Talk to your health care provider about how often you should have regular mammograms. Talk with your health care provider about your test results, treatment options, and if necessary, the need for more tests. Vaccines  Your health care provider may recommend certain vaccines, such as:  Influenza vaccine. This is recommended every year.  Tetanus, diphtheria, and acellular pertussis (Tdap, Td) vaccine. You may  need a Td booster every 10 years.  Zoster vaccine. You may need this after age 70.  Pneumococcal 13-valent conjugate (PCV13) vaccine. One dose is recommended after age 30.  Pneumococcal polysaccharide (PPSV23)  vaccine. One dose is recommended after age 70. Talk to your health care provider about which screenings and vaccines you need and how often you need them. This information is not intended to replace advice given to you by your health care provider. Make sure you discuss any questions you have with your health care provider. Document Released: 08/30/2015 Document Revised: 04/22/2016 Document Reviewed: 06/04/2015 Elsevier Interactive Patient Education  2017 [place] Prevention in the Home Falls can cause injuries. They can happen to people of all ages. There are many things you can do to make your home safe and to help prevent falls. What can I do on the outside of my home?  Regularly fix the edges of walkways and driveways and fix any cracks.  Remove anything that might make you trip as you walk through a door, such as a raised step or threshold.  Trim any bushes or trees on the path to your home.  Use bright outdoor lighting.  Clear any walking paths of anything that might make someone trip, such as rocks or tools.  Regularly check to see if handrails are loose or broken. Make sure that both sides of any steps have handrails.  Any raised decks and porches should have guardrails on the edges.  Have any leaves, snow, or ice cleared regularly.  Use sand or salt on walking paths during winter.  Clean up any spills in your garage right away. This includes oil or grease spills. What can I do in the bathroom?  Use night lights.  Install grab bars by the toilet and in the tub and shower. Do not use towel bars as grab bars.  Use non-skid mats or decals in the tub or shower.  If you need to sit down in the shower, use a plastic, non-slip stool.  Keep the floor dry. Clean up any water that spills on the floor as soon as it happens.  Remove soap buildup in the tub or shower regularly.  Attach bath mats securely with double-sided non-slip rug tape.  Do not have throw rugs  and other things on the floor that can make you trip. What can I do in the bedroom?  Use night lights.  Make sure that you have a light by your bed that is easy to reach.  Do not use any sheets or blankets that are too big for your bed. They should not hang down onto the floor.  Have a firm chair that has side arms. You can use this for support while you get dressed.  Do not have throw rugs and other things on the floor that can make you trip. What can I do in the kitchen?  Clean up any spills right away.  Avoid walking on wet floors.  Keep items that you use a lot in easy-to-reach places.  If you need to reach something above you, use a strong step stool that has a grab bar.  Keep electrical cords out of the way.  Do not use floor polish or wax that makes floors slippery. If you must use wax, use non-skid floor wax.  Do not have throw rugs and other things on the floor that can make you trip. What can I do with my stairs?  Do not leave any items on  the stairs.  Make sure that there are handrails on both sides of the stairs and use them. Fix handrails that are broken or loose. Make sure that handrails are as long as the stairways.  Check any carpeting to make sure that it is firmly attached to the stairs. Fix any carpet that is loose or worn.  Avoid having throw rugs at the top or bottom of the stairs. If you do have throw rugs, attach them to the floor with carpet tape.  Make sure that you have a light switch at the top of the stairs and the bottom of the stairs. If you do not have them, ask someone to add them for you. What else can I do to help prevent falls?  Wear shoes that:  Do not have high heels.  Have rubber bottoms.  Are comfortable and fit you well.  Are closed at the toe. Do not wear sandals.  If you use a stepladder:  Make sure that it is fully opened. Do not climb a closed stepladder.  Make sure that both sides of the stepladder are locked into  place.  Ask someone to hold it for you, if possible.  Clearly mark and make sure that you can see:  Any grab bars or handrails.  First and last steps.  Where the edge of each step is.  Use tools that help you move around (mobility aids) if they are needed. These include:  Canes.  Walkers.  Scooters.  Crutches.  Turn on the lights when you go into a dark area. Replace any light bulbs as soon as they burn out.  Set up your furniture so you have a clear path. Avoid moving your furniture around.  If any of your floors are uneven, fix them.  If there are any pets around you, be aware of where they are.  Review your medicines with your doctor. Some medicines can make you feel dizzy. This can increase your chance of falling. Ask your doctor what other things that you can do to help prevent falls. This information is not intended to replace advice given to you by your health care provider. Make sure you discuss any questions you have with your health care provider. Document Released: 05/30/2009 Document Revised: 01/09/2016 Document Reviewed: 09/07/2014 Elsevier Interactive Patient Education  2017 [employer].

## 2019-05-16 NOTE — Progress Notes (Signed)
This patient, [name],  has given consent to perform this visit via video.

## 2019-05-16 NOTE — Progress Notes (Signed)
Subjective:   Katherine Heath is a 68 y.o. female who presents for Medicare Annual (Subsequent) preventive examination.  This visit type was conducted due to national recommendations for restrictions regarding the COVID-19 Pandemic (e.g. social distancing). This format is felt to be most appropriate for this patient at this time. All issues noted in this document were discussed and addressed. No physical exam was performed (except for noted visual exam findings with Video Visits). The patient has given consent to perform this visit via video. Vital signs may be absent or patient reported.   Patient location: at home   Nurse location:  [place] office   Review of Systems:  n/a Cardiac Risk Factors include: advanced age (>7men, >55 women);dyslipidemia     Objective:     Vitals: BP 110/73 Comment: per patient  Pulse 73 Comment: per patient  Temp 98.1 F (36.7 C) Comment: per patient  Ht 5' 2.5" (1.588 m) Comment: per patient  Wt 135 lb 11.2 oz (61.6 kg) Comment: per patient  BMI 24.42 kg/m   Body mass index is 24.42 kg/m.  Advanced Directives 05/16/2019  Does Patient Have a Medical Advance Directive? Yes  Type of Paramedic of [place];Living will  Copy of [place] in Chart? No - copy requested    Tobacco Social History   Tobacco Use  Smoking Status Never Smoker  Smokeless Tobacco Never Used     Counseling given: Not Answered   Clinical Intake:  Pre-visit preparation completed: Yes  Pain : No/denies pain     Nutritional Status: BMI of 19-24  Normal Nutritional Risks: None Diabetes: No  How often do you need to have someone help you when you read instructions, pamphlets, or other written materials from your doctor or pharmacy?: 1 - Never What is the last grade level you completed in school?: 19 years  Interpreter Needed?: No  Information entered by :: NAllen LPN  Past Medical History:  Diagnosis Date  . Arthritis    . Depression   . GERD (gastroesophageal reflux disease)   . Hyperlipidemia 06/17/2017  . Shortness of breath 11/23/2015   Past Surgical History:  Procedure Laterality Date  . sphincterotomy  2003   Family History  Problem Relation Age of Onset  . Heart disease Father   . Heart disease Maternal Grandmother   . Hypertension Maternal Grandmother   . Heart disease Paternal Grandfather   . Stroke Maternal Grandfather    Social History   Socioeconomic History  . Marital status: Married    Spouse name: Not on file  . Number of children: Not on file  . Years of education: Not on file  . Highest education level: Not on file  Occupational History  . Occupation: retired  Scientific laboratory technician  . Financial resource strain: Not hard at all  . Food insecurity    Worry: Never true    Inability: Never true  . Transportation needs    Medical: No    Non-medical: No  Tobacco Use  . Smoking status: Never Smoker  . Smokeless tobacco: Never Used  Substance and Sexual Activity  . Alcohol use: Not Currently    Alcohol/week: 0.0 standard drinks  . Drug use: No  . Sexual activity: Not Currently  Lifestyle  . Physical activity    Days per week: 7 days    Minutes per session: 90 min  . Stress: To some extent  Relationships  . Social connections    Talks on phone: Not on  file    Gets together: Not on file    Attends religious service: Not on file    Active member of club or organization: Not on file    Attends meetings of clubs or organizations: Not on file    Relationship status: Not on file  Other Topics Concern  . Not on file  Social History Narrative  . Not on file    Outpatient Encounter Medications as of 05/16/2019  Medication Sig  . BLACK COHOSH EXTRACT PO Take 2 tablets by mouth.  . Cholecalciferol (VITAMIN D-3 PO) Take by mouth daily. 6000 iu daily  . ELDERBERRY PO Take 3 tablets by mouth.  . magnesium oxide (MAG-OX) 400 MG tablet Take 400 mg by mouth daily.  . Progesterone  Micronized (PROGESTERONE PO) Take 50 mg by mouth.   . TURMERIC PO Take by mouth.  [place] Kitchen VITAMIN K PO Take by mouth.  . Calcium Carbonate-Vit D-Min (CALCIUM 1200 PO) Take by mouth.   No facility-administered encounter medications on file as of 05/16/2019.     Activities of Daily Living In your present state of health, do you have any difficulty performing the following activities: 05/16/2019  Hearing? N  Vision? N  Difficulty concentrating or making decisions? Y  Comment Has ADD  Walking or climbing stairs? N  Dressing or bathing? N  Doing errands, shopping? N  Preparing Food and eating ? N  Using the Toilet? N  In the past six months, have you accidently leaked urine? Y  Do you have problems with loss of bowel control? N  Managing your Medications? N  Managing your Finances? N  Housekeeping or managing your Housekeeping? N  Some recent data might be hidden    Patient Care Team: [name], MD as PCP - General (Internal Medicine)    Assessment:   This is a routine wellness examination for Katherine Heath.  Exercise Activities and Dietary recommendations Current Exercise Habits: Home exercise routine, Type of exercise: calisthenics;walking, Time (Minutes): > 60, Frequency (Times/Week): 7, Weekly Exercise (Minutes/Week): 0  Goals    . Weight (lb) < 200 lb (90.7 kg)     05/16/2019, wants to get to 117 pounds       Fall Risk Fall Risk  05/16/2019 01/31/2019 09/29/2018 05/26/2018  Falls in the past year? 1 1 1  Yes  Comment 4 falls due to tripping and slipping; walking on trails - - -  Number falls in past yr: 1 1 1 1   Injury with Fall? 0 0 1 No  Risk for fall due to : History of fall(s) - - -  Follow up Falls evaluation completed;Education provided;Falls prevention discussed - - -   Is the patient's home free of loose throw rugs in walkways, pet beds, electrical cords, etc?   yes      Grab bars in the bathroom? no      Handrails on the stairs?   yes      Adequate lighting?   yes   Timed Get Up and Go performed: n/a  Depression Screen PHQ 2/9 Scores 05/16/2019 01/31/2019 09/29/2018 05/26/2018  PHQ - 2 Score 1 0 0 0  PHQ- 9 Score 8 - - -     Cognitive Function     6CIT Screen 05/16/2019  What Year? 0 points  What month? 0 points  What time? 0 points  Count back from 20 0 points  Months in reverse 0 points  Repeat phrase 0 points  Total Score 0    Immunization  History  Administered Date(s) Administered  . Influenza, High Dose Seasonal PF 05/26/2018  . Pneumococcal Conjugate-13 06/06/2018  . Tdap 12/14/2018    Qualifies for Shingles Vaccine? yes  Screening Tests Health Maintenance  Topic Date Due  . COLONOSCOPY  02/09/2001  . INFLUENZA VACCINE  03/18/2019  . PNA vac Low Risk Adult (2 of 2 - PPSV23) 06/07/2019  . MAMMOGRAM  04/04/2021  . TETANUS/TDAP  12/13/2028  . DEXA SCAN  Completed  . Hepatitis C Screening  Completed    Cancer Screenings: Lung: Low Dose CT Chest recommended if Age 11-80 years, 30 pack-year currently smoking OR have quit w/in 15years. Patient does not qualify. Breast:  Up to date on Mammogram? Yes   Up to date of Bone Density/Dexa? Yes Colorectal: up to date per patient  Additional Screenings: : Hepatitis C Screening: 02/2018     Plan:    Patient wants to get to 117 pounds. Depression screen showed some issues. PCP undated. She will be following up with an earlier appointment for patient.   I have personally reviewed and noted the following in the patient's chart:   . Medical and social history . Use of alcohol, tobacco or illicit drugs  . Current medications and supplements . Functional ability and status . Nutritional status . Physical activity . Advanced directives . List of other physicians . Hospitalizations, surgeries, and ER visits in previous 12 months . Vitals . Screenings to include cognitive, depression, and falls . Referrals and appointments  In addition, I have reviewed and discussed with patient  certain preventive protocols, quality metrics, and best practice recommendations. A written personalized care plan for preventive services as well as general preventive health recommendations were provided to patient.     Kellie Simmering, LPN  [id number]

## 2019-06-06 ENCOUNTER — Other Ambulatory Visit: Payer: Self-pay

## 2019-06-06 ENCOUNTER — Encounter: Payer: Self-pay | Admitting: Internal Medicine

## 2019-06-06 ENCOUNTER — Ambulatory Visit (INDEPENDENT_AMBULATORY_CARE_PROVIDER_SITE_OTHER): Payer: Medicare Other | Admitting: Internal Medicine

## 2019-06-06 ENCOUNTER — Ambulatory Visit: Payer: No Typology Code available for payment source

## 2019-06-06 VITALS — BP 114/60 | HR 63 | Temp 98.1°F | Ht 62.5 in | Wt 139.6 lb

## 2019-06-06 DIAGNOSIS — N951 Menopausal and female climacteric states: Secondary | ICD-10-CM

## 2019-06-06 DIAGNOSIS — F5101 Primary insomnia: Secondary | ICD-10-CM | POA: Diagnosis not present

## 2019-06-06 DIAGNOSIS — L659 Nonscarring hair loss, unspecified: Secondary | ICD-10-CM | POA: Diagnosis not present

## 2019-06-06 DIAGNOSIS — R5383 Other fatigue: Secondary | ICD-10-CM

## 2019-06-06 NOTE — Patient Instructions (Signed)

## 2019-06-07 LAB — CMP14+EGFR
ALT: 12 IU/L (ref 0–32)
AST: 18 IU/L (ref 0–40)
Albumin/Globulin Ratio: 1.8 (ref 1.2–2.2)
Albumin: 4.2 g/dL (ref 3.8–4.8)
Alkaline Phosphatase: 75 IU/L (ref 39–117)
BUN/Creatinine Ratio: 30 — ABNORMAL HIGH (ref 12–28)
BUN: 28 mg/dL — ABNORMAL HIGH (ref 8–27)
Bilirubin Total: <0.2 mg/dL (ref 0.0–1.2)
CO2: 23 mmol/L (ref 20–29)
Calcium: 9.4 mg/dL (ref 8.7–10.3)
Chloride: 102 mmol/L (ref 96–106)
Creatinine, Ser: 0.92 mg/dL (ref 0.57–1.00)
GFR calc Af Amer: 74 mL/min/{1.73_m2} (ref >59)
GFR calc non Af Amer: 64 mL/min/{1.73_m2} (ref >59)
Globulin, Total: 2.4 g/dL (ref 1.5–4.5)
Glucose: 91 mg/dL (ref 65–99)
Potassium: 4.6 mmol/L (ref 3.5–5.2)
Sodium: 138 mmol/L (ref 134–144)
Total Protein: 6.6 g/dL (ref 6.0–8.5)

## 2019-06-07 LAB — TSH: TSH: 1.98 u[IU]/mL (ref 0.450–4.500)

## 2019-06-08 ENCOUNTER — Encounter: Payer: Self-pay | Admitting: Internal Medicine

## 2019-06-10 NOTE — Progress Notes (Signed)
Subjective:     Patient ID: Katherine Heath , female    DOB: Nov 12, 1950 , 68 y.o.   MRN: [id number]   Chief Complaint  Patient presents with  . Hormones f/u    HPI  She is here today for f/u BHRT. She feels pretty good on progesterone nightly. She would like to wean off of HRT completely by the end of the year. She reports feeling fatigued. Also feels it takes her awhile to recover from strenuous exercise. She is not sure what is contributing to her sx. She reports getting about six hours of sleep per night. Denies h/o OSA.     Past Medical History:  Diagnosis Date  . Arthritis   . Depression   . GERD (gastroesophageal reflux disease)   . Hyperlipidemia 06/17/2017  . Shortness of breath 11/23/2015     Family History  Problem Relation Age of Onset  . Heart disease Father   . Heart attack Father   . Heart disease Maternal Grandmother   . Hypertension Maternal Grandmother   . Heart disease Paternal Grandfather   . Stroke Mother   . Stroke Maternal Grandfather      Current Outpatient Medications:  .  BLACK COHOSH EXTRACT PO, Take 2 tablets by mouth., Disp: , Rfl:  .  Calcium Carbonate-Vit D-Min (CALCIUM 1200 PO), Take by mouth., Disp: , Rfl:  .  Cholecalciferol (VITAMIN D-3 PO), Take by mouth daily. 6000 iu daily, Disp: , Rfl:  .  ELDERBERRY PO, Take 3 tablets by mouth., Disp: , Rfl:  .  magnesium oxide (MAG-OX) 400 MG tablet, Take 400 mg by mouth daily., Disp: , Rfl:  .  Progesterone Micronized (PROGESTERONE PO), Take 50 mg by mouth. , Disp: , Rfl:  .  TURMERIC PO, Take by mouth., Disp: , Rfl:  .  VITAMIN K PO, Take by mouth., Disp: , Rfl:    Allergies  Allergen Reactions  . Latex Itching     Review of Systems  Constitutional: Negative.   Respiratory: Negative.   Cardiovascular: Negative.   Gastrointestinal: Negative.   Neurological: Negative.   Psychiatric/Behavioral: Negative.      Today's Vitals   06/06/19 1453  BP: 114/60  Pulse: 63  Temp: 98.1 F (36.7 C)   TempSrc: Oral  Weight: 139 lb 9.6 oz (63.3 kg)  Height: 5' 2.5" (1.588 m)  PainSc: 0-No pain   Body mass index is 25.13 kg/m.   Objective:  Physical Exam Vitals signs and nursing note reviewed.  Constitutional:      Appearance: Normal appearance.  HENT:     Head: Normocephalic and atraumatic.  Cardiovascular:     Rate and Rhythm: Normal rate and regular rhythm.     Heart sounds: Normal heart sounds.  Pulmonary:     Effort: Pulmonary effort is normal.     Breath sounds: Normal breath sounds.  Skin:    General: Skin is warm.  Neurological:     General: No focal deficit present.     Mental Status: She is alert.  Psychiatric:        Mood and Affect: Mood normal.        Behavior: Behavior normal.         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 She will continue with progesterone nightly except Sundays for now. She will rto in four months for re-evaluation.   2. Thinning hair  She is advised to increase her hydration and to take a MVI  daily. She would likely benefit from Biest 50/50; however, she does not wish to add to her HRT regimen. She will try hair, skin and nails vitamin. I will check a TSH today.   3. Primary insomnia  Importance of good bedtime hygiene was discussed with the patient. She is advised to take magnesium nightly. She may also benefit from Remifemin or Estroven nightly.   4. Fatigue, unspecified type  Likely multifactorial. We also discussed possibility of adrenal fatigue. Pt advised that I usually check salivary panel, but we could do a dexamethasone suppression test to evaluate cortisol and ACTH levels. If low, she is agreeable to seeing an endocrinologist for further evaluation.   - TSH - CMP14+EGFR        [name], MD    THE PATIENT IS ENCOURAGED TO PRACTICE SOCIAL DISTANCING DUE TO THE COVID-19 PANDEMIC.

## 2019-06-13 ENCOUNTER — Ambulatory Visit: Payer: Medicare Other | Admitting: Internal Medicine

## 2019-06-26 ENCOUNTER — Encounter: Payer: Self-pay | Admitting: Internal Medicine

## 2019-07-21 ENCOUNTER — Encounter: Payer: Self-pay | Admitting: Internal Medicine

## 2019-10-11 ENCOUNTER — Ambulatory Visit (INDEPENDENT_AMBULATORY_CARE_PROVIDER_SITE_OTHER): Payer: Medicare Other | Admitting: Internal Medicine

## 2019-10-11 ENCOUNTER — Encounter: Payer: Self-pay | Admitting: Internal Medicine

## 2019-10-11 ENCOUNTER — Other Ambulatory Visit: Payer: Self-pay

## 2019-10-11 VITALS — BP 112/70 | HR 69 | Temp 97.9°F | Ht 62.5 in | Wt 140.8 lb

## 2019-10-11 DIAGNOSIS — Z23 Encounter for immunization: Secondary | ICD-10-CM

## 2019-10-11 DIAGNOSIS — N951 Menopausal and female climacteric states: Secondary | ICD-10-CM

## 2019-10-11 DIAGNOSIS — R5383 Other fatigue: Secondary | ICD-10-CM | POA: Diagnosis not present

## 2019-10-12 MED ORDER — SHINGRIX 50 MCG/0.5ML IM SUSR: 0.5000 mL | Freq: Once | INTRAMUSCULAR | 1 refills | Status: AC

## 2019-10-12 NOTE — Patient Instructions (Signed)
Zoster Vaccine, Recombinant injection What is this medicine? ZOSTER VACCINE (ZOS ter vak SEEN) is used to prevent shingles in adults 69 years old and over. This vaccine is not used to treat shingles or nerve pain from shingles. This medicine may be used for other purposes; ask your health care provider or pharmacist if you have questions. COMMON BRAND NAME(S): SHINGRIX What should I tell my health care provider before I take this medicine? They need to know if you have any of these conditions:  blood disorders or disease  cancer like leukemia or lymphoma  immune system problems or therapy  an unusual or allergic reaction to vaccines, other medications, foods, dyes, or preservatives  pregnant or trying to get pregnant  breast-feeding How should I use this medicine? This vaccine is for injection in a muscle. It is given by a health care professional. Talk to your pediatrician regarding the use of this medicine in children. This medicine is not approved for use in children. Overdosage: If you think you have taken too much of this medicine contact a poison control center or emergency room at once. NOTE: This medicine is only for you. Do not share this medicine with others. What if I miss a dose? Keep appointments for follow-up (booster) doses as directed. It is important not to miss your dose. Call your doctor or health care professional if you are unable to keep an appointment. What may interact with this medicine?  medicines that suppress your immune system  medicines to treat cancer  steroid medicines like prednisone or cortisone This list may not describe all possible interactions. Give your health care provider a list of all the medicines, herbs, non-prescription drugs, or dietary supplements you use. Also tell them if you smoke, drink alcohol, or use illegal drugs. Some items may interact with your medicine. What should I watch for while using this medicine? Visit your doctor for  regular check ups. This vaccine, like all vaccines, may not fully protect everyone. What side effects may I notice from receiving this medicine? Side effects that you should report to your doctor or health care professional as soon as possible:  allergic reactions like skin rash, itching or hives, swelling of the face, lips, or tongue  breathing problems Side effects that usually do not require medical attention (report these to your doctor or health care professional if they continue or are bothersome):  chills  headache  fever  nausea, vomiting  redness, warmth, pain, swelling or itching at site where injected  tiredness This list may not describe all possible side effects. Call your doctor for medical advice about side effects. You may report side effects to FDA at 1-800-FDA-1088. Where should I keep my medicine? This vaccine is only given in a clinic, pharmacy, doctor's office, or other health care setting and will not be stored at home. NOTE: This sheet is a summary. It may not cover all possible information. If you have questions about this medicine, talk to your doctor, pharmacist, or health care provider.  2020 Elsevier/Gold Standard (2017-03-15 13:20:30)  

## 2019-10-12 NOTE — Progress Notes (Signed)
This visit occurred during the SARS-CoV-2 public health emergency.  Safety protocols were in place, including screening questions prior to the visit, additional usage of staff PPE, and extensive cleaning of exam room while observing appropriate contact time as indicated for disinfecting solutions.  Subjective:     Patient ID: Katherine Heath , female    DOB: [birth year] , 69 y.o.   MRN: [id number]   Chief Complaint  Patient presents with  . Hormones f/u    HPI  She is here today for f/u BHRT.  She was weaned off of her regimen after her last visit, progesterone was the last hormone she stopped taking. She feels well. She has not had any recurrence of symptoms. She reports she sleeps well at night. Of note, she feels like her fatigue has improved with increase in her electrolytes consumption. She has no new complaints.     Past Medical History:  Diagnosis Date  . Arthritis   . Depression   . GERD (gastroesophageal reflux disease)   . Hyperlipidemia 06/17/2017  . Shortness of breath 11/23/2015     Family History  Problem Relation Age of Onset  . Heart disease Father   . Heart attack Father   . Heart disease Maternal Grandmother   . Hypertension Maternal Grandmother   . Heart disease Paternal Grandfather   . Stroke Mother   . Stroke Maternal Grandfather      Current Outpatient Medications:  .  BLACK COHOSH EXTRACT PO, Take 1 tablet by mouth. , Disp: , Rfl:  .  Cholecalciferol (VITAMIN D-3 PO), Take by mouth daily. 6000 iu daily, Disp: , Rfl:  .  ELDERBERRY PO, Take 3 tablets by mouth., Disp: , Rfl:  .  NON FORMULARY, DIM, Disp: , Rfl:  .  VITAMIN K PO, Take by mouth., Disp: , Rfl:    Allergies  Allergen Reactions  . Latex Itching     Review of Systems  Constitutional: Negative.   Respiratory: Negative.   Cardiovascular: Negative.   Gastrointestinal: Negative.   Neurological: Negative.   Psychiatric/Behavioral: Negative.      Today's Vitals   10/11/19 1542  BP:  112/70  Pulse: 69  Temp: 97.9 F (36.6 C)  TempSrc: Oral  Weight: 140 lb 12.8 oz (63.9 kg)  Height: 5' 2.5" (1.588 m)  PainSc: 0-No pain   Body mass index is 25.34 kg/m.   Objective:  Physical Exam Vitals and nursing note reviewed.  Constitutional:      Appearance: Normal appearance.  HENT:     Head: Normocephalic and atraumatic.  Cardiovascular:     Rate and Rhythm: Normal rate and regular rhythm.     Heart sounds: Normal heart sounds.  Pulmonary:     Effort: Pulmonary effort is normal.     Breath sounds: Normal breath sounds.  Skin:    General: Skin is warm.  Neurological:     General: No focal deficit present.     Mental Status: She is alert.  Psychiatric:        Mood and Affect: Mood normal.        Behavior: Behavior normal.         Assessment And Plan:     1. Postmenopausal disorder  She is doing well off of BHRT.  There is no need for f/u at this time.   2. Other fatigue  Improved. She is encouraged to continue with her current regimen.   3. Immunization due  Rx Shingrix was sent to the pharmacy. She  is advised to get no earlier than 3/26 due to timing of her COVID vaccination. She verbalizes understanding of her rx plan.   [name], MD    THE PATIENT IS ENCOURAGED TO PRACTICE SOCIAL DISTANCING DUE TO THE COVID-19 PANDEMIC.

## 2020-05-21 ENCOUNTER — Ambulatory Visit: Payer: No Typology Code available for payment source | Admitting: Internal Medicine

## 2020-05-21 ENCOUNTER — Encounter: Payer: Self-pay | Admitting: Internal Medicine

## 2020-05-29 ENCOUNTER — Encounter: Payer: Self-pay | Admitting: Internal Medicine

## 2020-05-29 ENCOUNTER — Ambulatory Visit (INDEPENDENT_AMBULATORY_CARE_PROVIDER_SITE_OTHER): Payer: Medicare Other | Admitting: Internal Medicine

## 2020-05-29 ENCOUNTER — Other Ambulatory Visit: Payer: Self-pay

## 2020-05-29 ENCOUNTER — Ambulatory Visit (INDEPENDENT_AMBULATORY_CARE_PROVIDER_SITE_OTHER): Payer: Medicare Other

## 2020-05-29 VITALS — BP 112/58 | HR 68 | Temp 98.4°F | Ht 62.8 in | Wt 144.2 lb

## 2020-05-29 VITALS — BP 112/58 | HR 68 | Temp 98.4°F | Ht 62.8 in | Wt 144.0 lb

## 2020-05-29 DIAGNOSIS — I700 Atherosclerosis of aorta: Secondary | ICD-10-CM

## 2020-05-29 DIAGNOSIS — R5383 Other fatigue: Secondary | ICD-10-CM

## 2020-05-29 DIAGNOSIS — F322 Major depressive disorder, single episode, severe without psychotic features: Secondary | ICD-10-CM

## 2020-05-29 DIAGNOSIS — N951 Menopausal and female climacteric states: Secondary | ICD-10-CM

## 2020-05-29 DIAGNOSIS — E559 Vitamin D deficiency, unspecified: Secondary | ICD-10-CM | POA: Diagnosis not present

## 2020-05-29 DIAGNOSIS — E7800 Pure hypercholesterolemia, unspecified: Secondary | ICD-10-CM

## 2020-05-29 DIAGNOSIS — M79671 Pain in right foot: Secondary | ICD-10-CM

## 2020-05-29 DIAGNOSIS — F32A Depression, unspecified: Secondary | ICD-10-CM

## 2020-05-29 DIAGNOSIS — Z0000 Encounter for general adult medical examination without abnormal findings: Secondary | ICD-10-CM | POA: Diagnosis not present

## 2020-05-29 NOTE — Progress Notes (Signed)
This visit occurred during the SARS-CoV-2 public health emergency.  Safety protocols were in place, including screening questions prior to the visit, additional usage of staff PPE, and extensive cleaning of exam room while observing appropriate contact time as indicated for disinfecting solutions.  Subjective:   Katherine Heath is a 69 y.o. female who presents for Medicare Annual (Subsequent) preventive examination.  Review of Systems     Cardiac Risk Factors include: advanced age (>50men, >41 women);dyslipidemia     Objective:    Today's Vitals   05/29/20 0906  BP: (!) 112/58  Pulse: 68  Temp: 98.4 F (36.9 C)  TempSrc: Oral  SpO2: 96%  Weight: 144 lb 3.2 oz (65.4 kg)  Height: 5' 2.8" (1.595 m)   Body mass index is 25.71 kg/m.  Advanced Directives 05/29/2020 05/16/2019  Does Patient Have a Medical Advance Directive? Yes Yes  Type of Paramedic of [place];Living will [place];Living will  Copy of [place] in Chart? No - copy requested No - copy requested    Current Medications (verified) Outpatient Encounter Medications as of 05/29/2020  Medication Sig  . BLACK COHOSH EXTRACT PO Take 1 tablet by mouth.   . Cholecalciferol (VITAMIN D-3 PO) Take by mouth daily. 6000 iu daily  . ELDERBERRY PO Take 3 tablets by mouth.  . magnesium oxide (MAG-OX) 400 MG tablet Take 400 mg by mouth daily.  . NON FORMULARY DIM  . VITAMIN K PO Take by mouth.   No facility-administered encounter medications on file as of 05/29/2020.    Allergies (verified) Latex   History: Past Medical History:  Diagnosis Date  . Arthritis   . Depression   . GERD (gastroesophageal reflux disease)   . Hyperlipidemia 06/17/2017  . Shortness of breath 11/23/2015   Past Surgical History:  Procedure Laterality Date  . sphincterotomy  2003   Family History  Problem Relation Age of Onset  . Heart disease Father   . Heart attack Father   .  Heart disease Maternal Grandmother   . Hypertension Maternal Grandmother   . Heart disease Paternal Grandfather   . Stroke Mother   . Stroke Maternal Grandfather    Social History   Socioeconomic History  . Marital status: Married    Spouse name: Not on file  . Number of children: Not on file  . Years of education: Not on file  . Highest education level: Not on file  Occupational History  . Occupation: retired  Tobacco Use  . Smoking status: Never Smoker  . Smokeless tobacco: Never Used  Vaping Use  . Vaping Use: Never used  Substance and Sexual Activity  . Alcohol use: Not Currently    Alcohol/week: 0.0 standard drinks  . Drug use: No  . Sexual activity: Not Currently  Other Topics Concern  . Not on file  Social History Narrative  . Not on file   Social Determinants of Health   Financial Resource Strain: Low Risk   . Difficulty of Paying Living Expenses: Not hard at all  Food Insecurity: No Food Insecurity  . Worried About Charity fundraiser in the Last Year: Never true  . Ran Out of Food in the Last Year: Never true  Transportation Needs: No Transportation Needs  . Lack of Transportation (Medical): No  . Lack of Transportation (Non-Medical): No  Physical Activity: Sufficiently Active  . Days of Exercise per Week: 7 days  . Minutes of Exercise per Session: 40 min  Stress: No Stress Concern Present  . Feeling of Stress : Not at all  Social Connections:   . Frequency of Communication with Friends and Family: Not on file  . Frequency of Social Gatherings with Friends and Family: Not on file  . Attends Religious Services: Not on file  . Active Member of Clubs or Organizations: Not on file  . Attends Archivist Meetings: Not on file  . Marital Status: Not on file    Tobacco Counseling Counseling given: Not Answered   Clinical Intake:  Pre-visit preparation completed: Yes  Pain : No/denies pain     Nutritional Status: BMI 25 -29  Overweight Nutritional Risks: None  How often do you need to have someone help you when you read instructions, pamphlets, or other written materials from your doctor or pharmacy?: 1 - Never What is the last grade level you completed in school?: master's degree  Diabetic? no  Interpreter Needed?: No  Information entered by :: NAllen LPN   Activities of Daily Living In your present state of health, do you have any difficulty performing the following activities: 05/29/2020  Hearing? N  Vision? N  Difficulty concentrating or making decisions? Y  Walking or climbing stairs? N  Dressing or bathing? N  Doing errands, shopping? N  Preparing Food and eating ? N  Using the Toilet? N  In the past six months, have you accidently leaked urine? Y  Do you have problems with loss of bowel control? N  Managing your Medications? N  Managing your Finances? N  Housekeeping or managing your Housekeeping? N  Some recent data might be hidden    Patient Care Team: [name], MD as PCP - General (Internal Medicine)  Indicate any recent Medical Services you may have received from other than [facility] providers in the past year (date may be approximate).     Assessment:   This is a routine wellness examination for Katherine Heath.  Hearing/Vision screen  Hearing Screening   125Hz  250Hz  500Hz  1000Hz  2000Hz  3000Hz  4000Hz  6000Hz  8000Hz   Right ear:           Left ear:           Vision Screening Comments: Regular eye exams, Dr. Laban Emperor  Dietary issues and exercise activities discussed: Current Exercise Habits: Home exercise routine, Type of exercise: walking, Time (Minutes): 40, Frequency (Times/Week): 7, Weekly Exercise (Minutes/Week): 280  Goals    . Patient Stated     05/29/2020, wants to weigh 117 pounds    . Weight (lb) < 200 lb (90.7 kg)     05/16/2019, wants to get to 117 pounds      Depression Screen PHQ 2/9 Scores 05/29/2020 05/16/2019 01/31/2019 09/29/2018 05/26/2018  PHQ - 2 Score 1 1 0 0 0   PHQ- 9 Score 11 8 - - -    Fall Risk Fall Risk  05/29/2020 05/16/2019 01/31/2019 09/29/2018 05/26/2018  Falls in the past year? 1 1 1 1  Yes  Comment slipped on mud or stumbled over a branch 4 falls due to tripping and slipping; walking on trails - - -  Number falls in past yr: 1 1 1 1 1   Injury with Fall? 0 0 0 1 No  Risk for fall due to : History of fall(s) History of fall(s) - - -  Follow up Falls evaluation completed;Education provided;Falls prevention discussed Falls evaluation completed;Education provided;Falls prevention discussed - - -    Any stairs in or around the home? Yes  If so,  are there any without handrails? Yes  Home free of loose throw rugs in walkways, pet beds, electrical cords, etc? Yes  Adequate lighting in your home to reduce risk of falls? Yes   ASSISTIVE DEVICES UTILIZED TO PREVENT FALLS:  Life alert? No  Use of a cane, walker or w/c? No  Grab bars in the bathroom? No  Shower chair or bench in shower? Yes  Elevated toilet seat or a handicapped toilet? No   TIMED UP AND GO:  Was the test performed? No .   Gait steady and fast without use of assistive device  Cognitive Function:     6CIT Screen 05/29/2020 05/16/2019  What Year? 0 points 0 points  What month? 0 points 0 points  What time? 0 points 0 points  Count back from 20 0 points 0 points  Months in reverse 0 points 0 points  Repeat phrase 0 points 0 points  Total Score 0 0    Immunizations Immunization History  Administered Date(s) Administered  . Influenza, High Dose Seasonal PF 05/26/2018  . PFIZER SARS-COV-2 Vaccination 09/22/2019, 10/13/2019  . Pneumococcal Conjugate-13 06/06/2018  . Tdap 12/14/2018  . Zoster Recombinat (Shingrix) 03/07/2020, 05/16/2020    TDAP status: Up to date Flu Vaccine status: Declined, Education has been provided regarding the importance of this vaccine but patient still declined. Advised may receive this vaccine at local pharmacy or Health Dept. Aware to  provide a copy of the vaccination record if obtained from local pharmacy or Health Dept. Verbalized acceptance and understanding. Pneumococcal vaccine status: Declined,  Education has been provided regarding the importance of this vaccine but patient still declined. Advised may receive this vaccine at local pharmacy or Health Dept. Aware to provide a copy of the vaccination record if obtained from local pharmacy or Health Dept. Verbalized acceptance and understanding.  Covid-19 vaccine status: Completed vaccines  Qualifies for Shingles Vaccine? Yes   Zostavax completed No   Shingrix Completed?: Yes  Screening Tests Health Maintenance  Topic Date Due  . INFLUENZA VACCINE  11/14/2020 (Originally 03/17/2020)  . PNA vac Low Risk Adult (2 of 2 - PPSV23) 05/29/2021 (Originally 06/07/2019)  . MAMMOGRAM  04/04/2021  . COLONOSCOPY  01/02/2026  . TETANUS/TDAP  12/13/2028  . DEXA SCAN  Completed  . COVID-19 Vaccine  Completed  . Hepatitis C Screening  Completed    Health Maintenance  There are no preventive care reminders to display for this patient.  Colorectal cancer screening: Completed 01/03/2016. Repeat every 10 years Mammogram status: Completed 04/05/2019. Repeat every 2 years Bone Density status: Completed 03/26/2019.   Lung Cancer Screening: (Low Dose CT Chest recommended if Age 2-80 years, 30 pack-year currently smoking OR have quit w/in 15years.) does not qualify.   Lung Cancer Screening Referral: no   Additional Screening:  Hepatitis C Screening: does qualify; Completed 03/01/2018  Vision Screening: Recommended annual ophthalmology exams for early detection of glaucoma and other disorders of the eye. Is the patient up to date with their annual eye exam?  No  Who is the provider or what is the name of the office in which the patient attends annual eye exams? Dr. Laban Emperor If pt is not established with a provider, would they like to be referred to a provider to establish care? No .    Dental Screening: Recommended annual dental exams for proper oral hygiene  Community Resource Referral / Chronic Care Management: CRR required this visit?  No   CCM required this visit?  No  Plan:     I have personally reviewed and noted the following in the patient's chart:   . Medical and social history . Use of alcohol, tobacco or illicit drugs  . Current medications and supplements . Functional ability and status . Nutritional status . Physical activity . Advanced directives . List of other physicians . Hospitalizations, surgeries, and ER visits in previous 12 months . Vitals . Screenings to include cognitive, depression, and falls . Referrals and appointments  In addition, I have reviewed and discussed with patient certain preventive protocols, quality metrics, and best practice recommendations. A written personalized care plan for preventive services as well as general preventive health recommendations were provided to patient.     Kellie Simmering, LPN   [id number]   Nurse Notes: Depression screen and suicide screen high. PCP made aware.

## 2020-05-29 NOTE — Patient Instructions (Signed)
Menopause and Hormone Replacement Therapy Menopause is a normal time of life when menstrual periods stop completely and the ovaries stop producing the female hormones estrogen and progesterone. This lack of hormones can affect your health and cause undesirable symptoms. Hormone replacement therapy (HRT) can relieve some of those symptoms. What is hormone replacement therapy? HRT is the use of artificial (synthetic) hormones to replace hormones that your body has stopped producing because you have reached menopause. What are my options for HRT?  HRT may consist of the synthetic hormones estrogen and progestin, or it may consist of only estrogen (estrogen-only therapy). You and your health care provider will decide which form of HRT is best for you. If you choose to be on HRT and you have a uterus, estrogen and progestin are usually prescribed. Estrogen-only therapy is used for women who do not have a uterus. Possible options for taking HRT include:  Pills.  Patches.  Gels.  Sprays.  Vaginal cream.  Vaginal rings.  Vaginal inserts. The amount of hormone(s) that you take and how long you take the hormone(s) varies according to your health. It is important to:  Begin HRT with the lowest possible dosage.  Stop HRT as soon as your health care provider tells you to stop.  Work with your health care provider so that you feel informed and comfortable with your decisions. What are the benefits of HRT? HRT can reduce the frequency and severity of menopausal symptoms. Benefits of HRT vary according to the kind of symptoms that you have, how severe they are, and your overall health. HRT may help to improve the following symptoms of menopause:  Hot flashes and night sweats. These are sudden feelings of heat that spread over the face and body. The skin may turn red, like a blush. Night sweats are hot flashes that happen while you are sleeping or trying to sleep.  Bone loss (osteoporosis). The  body loses calcium more quickly after menopause, causing the bones to become weaker. This can increase the risk for bone breaks (fractures).  Vaginal dryness. The lining of the vagina can become thin and dry, which can cause pain during sex or cause infection, burning, or itching.  Urinary tract infections.  Urinary incontinence. This is the inability to control when you pass urine.  Irritability.  Short-term memory problems. What are the risks of HRT? Risks of HRT vary depending on your individual health and medical history. Risks of HRT also depend on whether you receive both estrogen and progestin or you receive estrogen only. HRT may increase the risk of:  Spotting. This is when a small amount of blood leaks from the vagina unexpectedly.  Endometrial cancer. This cancer is in the lining of the uterus (endometrium).  Breast cancer.  Increased density of breast tissue. This can make it harder to find breast cancer on a breast X-ray (mammogram).  Stroke.  Heart disease.  Blood clots.  Gallbladder disease.  Liver disease. Risks of HRT can increase if you have any of the following conditions:  Endometrial cancer.  Liver disease.  Heart disease.  Breast cancer.  History of blood clots.  History of stroke. Follow these instructions at home:  Take over-the-counter and prescription medicines only as told by your health care provider.  Get mammograms, pelvic exams, and medical checkups as often as told by your health care provider.  Have Pap tests done as often as told by your health care provider. A Pap test is sometimes called a Pap smear. It   is a screening test that is used to check for signs of cancer of the cervix and vagina. A Pap test can also identify the presence of infection or precancerous changes. Pap tests may be done: ? Every 3 years, starting at age 21. ? Every 5 years, starting after age 30, in combination with testing for human papillomavirus  (HPV). ? More often or less often depending on other medical conditions you have, your age, and other risk factors.  It is up to you to get the results of your Pap test. Ask your health care provider, or the department that is doing the test, when your results will be ready.  Keep all follow-up visits as told by your health care provider. This is important. Contact a health care provider if you have:  Pain or swelling in your legs.  Shortness of breath.  Chest pain.  Lumps or changes in your breasts or armpits.  Slurred speech.  Pain, burning, or bleeding when you urinate.  Unusual vaginal bleeding.  Dizziness or headaches.  Weakness or numbness in any part of your arms or legs.  Pain in your abdomen. Summary  Menopause is a normal time of life when menstrual periods stop completely and the ovaries stop producing the female hormones estrogen and progesterone.  Hormone replacement therapy (HRT) can relieve some of the symptoms of menopause.  HRT can reduce the frequency and severity of menopausal symptoms.  Risks of HRT vary depending on your individual health and medical history. This information is not intended to replace advice given to you by your health care provider. Make sure you discuss any questions you have with your health care provider. Document Revised: 04/05/2018 Document Reviewed: 04/05/2018 Elsevier Patient Education  2020 [employer]  

## 2020-05-29 NOTE — Progress Notes (Signed)
I,Tianna Badgett,acting as a Education administrator for [name], MD.,have documented all relevant documentation on the behalf of [name], MD,as directed by  [name], MD while in the presence of [name], MD.  This visit occurred during the SARS-CoV-2 public health emergency.  Safety protocols were in place, including screening questions prior to the visit, additional usage of staff PPE, and extensive cleaning of exam room while observing appropriate contact time as indicated for disinfecting solutions.  Subjective:     Patient ID: Katherine Heath , female    DOB: 08/01/1951 , 69 y.o.   MRN: [id number]   Chief Complaint  Patient presents with  . hormones    HPI  She is here today for f/u BHRT.  She was weaned off of her regimen after her last visit, progesterone was the last hormone she stopped taking. She feels well. She has not had any recurrence of symptoms. She reports she sleeps well at night. Of note, she feels like her fatigue has improved with increase in her electrolytes consumption. She has no new complaints.     Past Medical History:  Diagnosis Date  . Arthritis   . Depression   . GERD (gastroesophageal reflux disease)   . Hyperlipidemia 06/17/2017  . Shortness of breath 11/23/2015     Family History  Problem Relation Age of Onset  . Heart disease Father   . Heart attack Father   . Heart disease Maternal Grandmother   . Hypertension Maternal Grandmother   . Heart disease Paternal Grandfather   . Stroke Mother   . Stroke Maternal Grandfather      Current Outpatient Medications:  .  BLACK COHOSH EXTRACT PO, Take 1 tablet by mouth. , Disp: , Rfl:  .  Cholecalciferol (VITAMIN D-3 PO), Take by mouth daily. 6000 iu daily, Disp: , Rfl:  .  ELDERBERRY PO, Take 3 tablets by mouth., Disp: , Rfl:  .  HYDROcodone-acetaminophen (NORCO/VICODIN) 5-325 MG tablet, Take 1 tablet by mouth every 6 (six) hours as needed for severe pain., Disp: 12 tablet, Rfl: 0 .  magnesium  oxide (MAG-OX) 400 MG tablet, Take 400 mg by mouth daily., Disp: , Rfl:  .  NON FORMULARY, DIM, Disp: , Rfl:  .  VITAMIN K PO, Take by mouth., Disp: , Rfl:    Allergies  Allergen Reactions  . Latex Itching     Review of Systems  Constitutional: Negative.   Respiratory: Negative.   Cardiovascular: Negative.   Gastrointestinal: Negative.   Musculoskeletal:       She c/o r foot pain. Only when wearing shoes. She states her sx started back in May - after trying on new pair of shoes. Not sure what else could be exacerbating her sx. Denies fall/trauma/injury.   Neurological: Negative.   Psychiatric/Behavioral: Positive for dysphoric mood.       She reports h/o depression. Has been on many meds in the past. They did not offer any benefit. They did not make her feel any better, often felt worse.      Today's Vitals   05/29/20 0925  BP: (!) 112/58  Pulse: 68  Temp: 98.4 F (36.9 C)  TempSrc: Oral  Weight: 144 lb (65.3 kg)  Height: 5' 2.8" (1.595 m)   Body mass index is 25.67 kg/m.   Objective:  Physical Exam Vitals and nursing note reviewed.  Constitutional:      Appearance: Normal appearance.  HENT:     Head: Normocephalic and atraumatic.  Cardiovascular:  Rate and Rhythm: Normal rate and regular rhythm.     Pulses:          Dorsalis pedis pulses are 2+ on the right side.     Heart sounds: Normal heart sounds.  Pulmonary:     Effort: Pulmonary effort is normal.     Breath sounds: Normal breath sounds.  Feet:     Right foot:     Skin integrity: Skin integrity normal.  Skin:    General: Skin is warm.  Neurological:     General: No focal deficit present.     Mental Status: She is alert.  Psychiatric:        Mood and Affect: Mood normal.        Behavior: Behavior normal.         Assessment And Plan:     1. Postmenopausal disorder Comments: Chronic, yet stable.  She is not on any hormonal therapy at this time.   2. Foot pain, right Comments: I will refer  her to Podiatry for further evaluation.  - Ambulatory referral to Podiatry  3. Aortic atherosclerosis ([place]) Comments: Chronic, encouraged to follow heart healthy lifestyle. Does not wish to take statin therapy at this time.  - NMR, lipoprofile  4. 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 Appears to be resistant to traditional therapies. She may benefit from ketamine therapy.   5. Vitamin D deficiency disease Comments: I will check vitamin D level and supplement as needed. - Vitamin D (25 hydroxy) - CMP14+EGFR  6. Pure hypercholesterolemia Comments: I will check non-fasting lipid panel today. Encouraged to limit fried food intake and to continue with her regular exercis regimen.  - TSH - NMR, lipoprofile  7. Other fatigue Comments: She may benefit from Endocrine evaluation for possible adrenal fatigue. Although, I suspect her sx are related to her depression.  - CBC no Diff - Cortisol     Patient was given opportunity to ask questions. Patient verbalized understanding of the plan and was able to repeat key elements of the plan. All questions were answered to their satisfaction.  [name], MD   I, [name], MD, have reviewed all documentation for this visit. The documentation on 06/30/20 for the exam, diagnosis, procedures, and orders are all accurate and complete.  THE PATIENT IS ENCOURAGED TO PRACTICE SOCIAL DISTANCING DUE TO THE COVID-19 PANDEMIC.

## 2020-05-29 NOTE — Patient Instructions (Signed)
Katherine Heath , Thank you for taking time to come for your Medicare Wellness Visit. I appreciate your ongoing commitment to your health goals. Please review the following plan we discussed and let me know if I can assist you in the future.   Screening recommendations/referrals: Colonoscopy: completed 01/03/2016 Mammogram: 04/05/2019 Bone Density: completed 04/05/2019 Recommended yearly ophthalmology/optometry visit for glaucoma screening and checkup Recommended yearly dental visit for hygiene and checkup  Vaccinations: Influenza vaccine: declines at this time Pneumococcal vaccine: declines at this time Tdap vaccine: completed 12/14/2018 Shingles vaccine: completed   Covid-19: 09/22/2019, 10/13/2019  Advanced directives: Please bring a copy of your POA (Power of Attorney) and/or Living Will to your next appointment.   Conditions/risks identified: none  Next appointment: Follow up in one year for your annual wellness visit    Preventive Care 65 Years and Older, Female Preventive care refers to lifestyle choices and visits with your health care provider that can promote health and wellness. What does preventive care include?  A yearly physical exam. This is also called an annual well check.  Dental exams once or twice a year.  Routine eye exams. Ask your health care provider how often you should have your eyes checked.  Personal lifestyle choices, including:  Daily care of your teeth and gums.  Regular physical activity.  Eating a healthy diet.  Avoiding tobacco and drug use.  Limiting alcohol use.  Practicing safe sex.  Taking low-dose aspirin every day.  Taking vitamin and mineral supplements as recommended by your health care provider. What happens during an annual well check? The services and screenings done by your health care provider during your annual well check will depend on your age, overall health, lifestyle risk factors, and family history of disease. Counseling   Your health care provider may ask you questions about your:  Alcohol use.  Tobacco use.  Drug use.  Emotional well-being.  Home and relationship well-being.  Sexual activity.  Eating habits.  History of falls.  Memory and ability to understand (cognition).  Work and work Statistician.  Reproductive health. Screening  You may have the following tests or measurements:  Height, weight, and BMI.  Blood pressure.  Lipid and cholesterol levels. These may be checked every 5 years, or more frequently if you are over 34 years old.  Skin check.  Lung cancer screening. You may have this screening every year starting at age 40 if you have a 30-pack-year history of smoking and currently smoke or have quit within the past 15 years.  Fecal occult blood test (FOBT) of the stool. You may have this test every year starting at age 46.  Flexible sigmoidoscopy or colonoscopy. You may have a sigmoidoscopy every 5 years or a colonoscopy every 10 years starting at age 67.  Hepatitis C blood test.  Hepatitis B blood test.  Sexually transmitted disease (STD) testing.  Diabetes screening. This is done by checking your blood sugar (glucose) after you have not eaten for a while (fasting). You may have this done every 1-3 years.  Bone density scan. This is done to screen for osteoporosis. You may have this done starting at age 75.  Mammogram. This may be done every 1-2 years. Talk to your health care provider about how often you should have regular mammograms. Talk with your health care provider about your test results, treatment options, and if necessary, the need for more tests. Vaccines  Your health care provider may recommend certain vaccines, such as:  Influenza vaccine. This  is recommended every year.  Tetanus, diphtheria, and acellular pertussis (Tdap, Td) vaccine. You may need a Td booster every 10 years.  Zoster vaccine. You may need this after age 87.  Pneumococcal 13-valent  conjugate (PCV13) vaccine. One dose is recommended after age 43.  Pneumococcal polysaccharide (PPSV23) vaccine. One dose is recommended after age 80. Talk to your health care provider about which screenings and vaccines you need and how often you need them. This information is not intended to replace advice given to you by your health care provider. Make sure you discuss any questions you have with your health care provider. Document Released: 08/30/2015 Document Revised: 04/22/2016 Document Reviewed: 06/04/2015 Elsevier Interactive Patient Education  2017 [place] Prevention in the Home Falls can cause injuries. They can happen to people of all ages. There are many things you can do to make your home safe and to help prevent falls. What can I do on the outside of my home?  Regularly fix the edges of walkways and driveways and fix any cracks.  Remove anything that might make you trip as you walk through a door, such as a raised step or threshold.  Trim any bushes or trees on the path to your home.  Use bright outdoor lighting.  Clear any walking paths of anything that might make someone trip, such as rocks or tools.  Regularly check to see if handrails are loose or broken. Make sure that both sides of any steps have handrails.  Any raised decks and porches should have guardrails on the edges.  Have any leaves, snow, or ice cleared regularly.  Use sand or salt on walking paths during winter.  Clean up any spills in your garage right away. This includes oil or grease spills. What can I do in the bathroom?  Use night lights.  Install grab bars by the toilet and in the tub and shower. Do not use towel bars as grab bars.  Use non-skid mats or decals in the tub or shower.  If you need to sit down in the shower, use a plastic, non-slip stool.  Keep the floor dry. Clean up any water that spills on the floor as soon as it happens.  Remove soap buildup in the tub or  shower regularly.  Attach bath mats securely with double-sided non-slip rug tape.  Do not have throw rugs and other things on the floor that can make you trip. What can I do in the bedroom?  Use night lights.  Make sure that you have a light by your bed that is easy to reach.  Do not use any sheets or blankets that are too big for your bed. They should not hang down onto the floor.  Have a firm chair that has side arms. You can use this for support while you get dressed.  Do not have throw rugs and other things on the floor that can make you trip. What can I do in the kitchen?  Clean up any spills right away.  Avoid walking on wet floors.  Keep items that you use a lot in easy-to-reach places.  If you need to reach something above you, use a strong step stool that has a grab bar.  Keep electrical cords out of the way.  Do not use floor polish or wax that makes floors slippery. If you must use wax, use non-skid floor wax.  Do not have throw rugs and other things on the floor that can make you  trip. What can I do with my stairs?  Do not leave any items on the stairs.  Make sure that there are handrails on both sides of the stairs and use them. Fix handrails that are broken or loose. Make sure that handrails are as long as the stairways.  Check any carpeting to make sure that it is firmly attached to the stairs. Fix any carpet that is loose or worn.  Avoid having throw rugs at the top or bottom of the stairs. If you do have throw rugs, attach them to the floor with carpet tape.  Make sure that you have a light switch at the top of the stairs and the bottom of the stairs. If you do not have them, ask someone to add them for you. What else can I do to help prevent falls?  Wear shoes that:  Do not have high heels.  Have rubber bottoms.  Are comfortable and fit you well.  Are closed at the toe. Do not wear sandals.  If you use a stepladder:  Make sure that it is fully  opened. Do not climb a closed stepladder.  Make sure that both sides of the stepladder are locked into place.  Ask someone to hold it for you, if possible.  Clearly mark and make sure that you can see:  Any grab bars or handrails.  First and last steps.  Where the edge of each step is.  Use tools that help you move around (mobility aids) if they are needed. These include:  Canes.  Walkers.  Scooters.  Crutches.  Turn on the lights when you go into a dark area. Replace any light bulbs as soon as they burn out.  Set up your furniture so you have a clear path. Avoid moving your furniture around.  If any of your floors are uneven, fix them.  If there are any pets around you, be aware of where they are.  Review your medicines with your doctor. Some medicines can make you feel dizzy. This can increase your chance of falling. Ask your doctor what other things that you can do to help prevent falls. This information is not intended to replace advice given to you by your health care provider. Make sure you discuss any questions you have with your health care provider. Document Released: 05/30/2009 Document Revised: 01/09/2016 Document Reviewed: 09/07/2014 Elsevier Interactive Patient Education  2017 [employer].

## 2020-05-30 ENCOUNTER — Ambulatory Visit (INDEPENDENT_AMBULATORY_CARE_PROVIDER_SITE_OTHER): Payer: Medicare Other | Admitting: Sports Medicine

## 2020-05-30 ENCOUNTER — Ambulatory Visit: Payer: Medicare Other

## 2020-05-30 ENCOUNTER — Ambulatory Visit (INDEPENDENT_AMBULATORY_CARE_PROVIDER_SITE_OTHER): Payer: Medicare Other

## 2020-05-30 ENCOUNTER — Encounter: Payer: Self-pay | Admitting: Sports Medicine

## 2020-05-30 DIAGNOSIS — S96911S Strain of unspecified muscle and tendon at ankle and foot level, right foot, sequela: Secondary | ICD-10-CM

## 2020-05-30 DIAGNOSIS — M7751 Other enthesopathy of right foot: Secondary | ICD-10-CM

## 2020-05-30 DIAGNOSIS — M79671 Pain in right foot: Secondary | ICD-10-CM

## 2020-05-30 DIAGNOSIS — M19079 Primary osteoarthritis, unspecified ankle and foot: Secondary | ICD-10-CM

## 2020-05-30 DIAGNOSIS — M779 Enthesopathy, unspecified: Secondary | ICD-10-CM | POA: Diagnosis not present

## 2020-05-30 DIAGNOSIS — M25571 Pain in right ankle and joints of right foot: Secondary | ICD-10-CM | POA: Diagnosis not present

## 2020-05-30 LAB — CMP14+EGFR
ALT: 16 IU/L (ref 0–32)
AST: 20 IU/L (ref 0–40)
Albumin/Globulin Ratio: 2 (ref 1.2–2.2)
Albumin: 4.5 g/dL (ref 3.8–4.8)
Alkaline Phosphatase: 80 IU/L (ref 44–121)
BUN/Creatinine Ratio: 30 — ABNORMAL HIGH (ref 12–28)
BUN: 25 mg/dL (ref 8–27)
Bilirubin Total: 0.3 mg/dL (ref 0.0–1.2)
CO2: 23 mmol/L (ref 20–29)
Calcium: 9.4 mg/dL (ref 8.7–10.3)
Chloride: 101 mmol/L (ref 96–106)
Creatinine, Ser: 0.83 mg/dL (ref 0.57–1.00)
GFR calc Af Amer: 83 mL/min/{1.73_m2} (ref >59)
GFR calc non Af Amer: 72 mL/min/{1.73_m2} (ref >59)
Globulin, Total: 2.2 g/dL (ref 1.5–4.5)
Glucose: 88 mg/dL (ref 65–99)
Potassium: 4.4 mmol/L (ref 3.5–5.2)
Sodium: 135 mmol/L (ref 134–144)
Total Protein: 6.7 g/dL (ref 6.0–8.5)

## 2020-05-30 LAB — TSH: TSH: 1.39 u[IU]/mL (ref 0.450–4.500)

## 2020-05-30 LAB — CBC
Hematocrit: 44.5 % (ref 34.0–46.6)
Hemoglobin: 14.4 g/dL (ref 11.1–15.9)
MCH: 31.3 pg (ref 26.6–33.0)
MCHC: 32.4 g/dL (ref 31.5–35.7)
MCV: 97 fL (ref 79–97)
Platelets: 336 10*3/uL (ref 150–450)
RBC: 4.6 x10E6/uL (ref 3.77–5.28)
RDW: 12.3 % (ref 11.7–15.4)
WBC: 7 10*3/uL (ref 3.4–10.8)

## 2020-05-30 LAB — NMR, LIPOPROFILE
Cholesterol, Total: 248 mg/dL — ABNORMAL HIGH (ref 100–199)
HDL Particle Number: 29.3 umol/L — ABNORMAL LOW (ref >=30.5)
HDL-C: 52 mg/dL (ref >39)
LDL Particle Number: 2071 nmol/L — ABNORMAL HIGH (ref <1000)
LDL Size: 21.1 nm (ref >20.5)
LDL-C (NIH Calc): 150 mg/dL — ABNORMAL HIGH (ref 0–99)
LP-IR Score: 49 — ABNORMAL HIGH (ref <=45)
Small LDL Particle Number: 905 nmol/L — ABNORMAL HIGH (ref <=527)
Triglycerides: 255 mg/dL — ABNORMAL HIGH (ref 0–149)

## 2020-05-30 LAB — VITAMIN D 25 HYDROXY (VIT D DEFICIENCY, FRACTURES): Vit D, 25-Hydroxy: 35.7 ng/mL (ref 30.0–100.0)

## 2020-05-30 LAB — CORTISOL: Cortisol: 8.2 ug/dL

## 2020-05-30 MED ORDER — TRIAMCINOLONE ACETONIDE 10 MG/ML IJ SUSP
10.0000 mg | Freq: Once | INTRAMUSCULAR | Status: AC
  Administered 2020-05-30: 10 mg

## 2020-05-30 NOTE — Progress Notes (Signed)
Subjective: Katherine Heath is a 68 y.o. female patient who presents to office for evaluation of right foot pain. Patient complains of progressive pain especially over the last several months with pain at lateral ankle after wearing a bad fitting pair of shoes in May of this year and reports that the pain on the top side of the foot along the fifth metatarsal started about 6 to 8 months ago after stepping on something and the area stayed painful ever since reports that there is direct pain to the foot has tried rest.  No other pedal complaints noted.  Review of systems noncontributory  Patient Active Problem List   Diagnosis Date Noted  . Hyperlipidemia 06/17/2017  . Exhaustion 05/26/2016  . Shortness of breath 11/23/2015  . Other fatigue 11/23/2015    Current Outpatient Medications on File Prior to Visit  Medication Sig Dispense Refill  . BLACK COHOSH EXTRACT PO Take 1 tablet by mouth.     . Cholecalciferol (VITAMIN D-3 PO) Take by mouth daily. 6000 iu daily    . ELDERBERRY PO Take 3 tablets by mouth.    . magnesium oxide (MAG-OX) 400 MG tablet Take 400 mg by mouth daily.    . NON FORMULARY DIM    . VITAMIN K PO Take by mouth.     No current facility-administered medications on file prior to visit.    Allergies  Allergen Reactions  . Latex Itching    Objective:  General: Alert and oriented x3 in no acute distress  Dermatology: No open lesions bilateral lower extremities, no webspace macerations, no ecchymosis bilateral, all nails x 10 are well manicured.  Vascular: Dorsalis Pedis and Posterior Tibial pedal pulses palpable, Capillary Fill Time 5 seconds,(+) pedal hair growth bilateral, mild edema noted to the lateral ankle on the right, significant varicosities noted, Temperature gradient within normal limits.  Neurology: Johney Maine sensation intact via light touch bilateral.  Musculoskeletal: Mild tenderness with palpation at peroneal tendon insertion and to sinus tarsi at lateral  ankle on right, tailors bunion deformity noted, Strength within normal limits in all groups bilateral.   Gait: Antalgic gait  Xrays  Right foot there is diffuse arthritis at the CC joint as well as the subtalar joint with increased fifth metatarsal angle supportive of tailor's bunion deformity.  No fracture or dislocation.  No other acute findings.  Assessment and Plan: Problem List Items Addressed This Visit    None    Visit Diagnoses    Pain in right foot    -  Primary   Relevant Orders   DG Foot Complete Right   Tendonitis       Relevant Medications   triamcinolone acetonide (KENALOG) 10 MG/ML injection 10 mg (Start on 05/30/2020  7:45 PM)   Arthritis of foot       Relevant Medications   triamcinolone acetonide (KENALOG) 10 MG/ML injection 10 mg (Start on 05/30/2020  7:45 PM)   Sinus tarsi syndrome of right foot       Capsulitis          -Complete examination performed -Xrays reviewed -Discussed treatement options for likely tendinitis vs capsulitis with underlying arthritis -After oral consent and aseptic prep, injected a mixture containing 1 ml of 2%  plain lidocaine, 1 ml 0.5% plain marcaine, 0.5 ml of kenalog 10 and 0.5 ml of dexamethasone phosphate into right lateral foot at area of maximum pain at peroneal tendon course without complication. Post-injection care discussed with patient.  -Dispensed ankle gauntlet for patient to  use as instructed on the right -Advised good supportive shoes -Advised rest ice elevation -Advised to limit activities until symptoms improve -Patient to return to office as needed in 1 month or sooner if condition worsens.  Landis Martins, DPM

## 2020-06-07 DIAGNOSIS — Z23 Encounter for immunization: Secondary | ICD-10-CM | POA: Diagnosis not present

## 2020-06-10 ENCOUNTER — Encounter: Payer: Self-pay | Admitting: Internal Medicine

## 2020-06-13 ENCOUNTER — Encounter: Payer: Self-pay | Admitting: Internal Medicine

## 2020-06-17 HISTORY — PX: WRIST SURGERY: SHX841

## 2020-06-23 ENCOUNTER — Other Ambulatory Visit: Payer: Self-pay

## 2020-06-23 ENCOUNTER — Encounter (HOSPITAL_COMMUNITY): Payer: Self-pay

## 2020-06-23 ENCOUNTER — Emergency Department (HOSPITAL_COMMUNITY)
Admission: EM | Admit: 2020-06-23 | Discharge: 2020-06-23 | Disposition: A | Discharge status: Home / Self Care | Payer: Medicare Other | Attending: Emergency Medicine | Admitting: Emergency Medicine

## 2020-06-23 ENCOUNTER — Emergency Department (HOSPITAL_COMMUNITY): Payer: Medicare Other

## 2020-06-23 DIAGNOSIS — S52612A Displaced fracture of left ulna styloid process, initial encounter for closed fracture: Secondary | ICD-10-CM | POA: Diagnosis not present

## 2020-06-23 DIAGNOSIS — S6992XA Unspecified injury of left wrist, hand and finger(s), initial encounter: Secondary | ICD-10-CM | POA: Diagnosis present

## 2020-06-23 DIAGNOSIS — S52572A Other intraarticular fracture of lower end of left radius, initial encounter for closed fracture: Secondary | ICD-10-CM | POA: Insufficient documentation

## 2020-06-23 DIAGNOSIS — Y92007 Garden or yard of unspecified non-institutional (private) residence as the place of occurrence of the external cause: Secondary | ICD-10-CM | POA: Diagnosis not present

## 2020-06-23 DIAGNOSIS — Z91040 Latex allergy status: Secondary | ICD-10-CM | POA: Diagnosis not present

## 2020-06-23 DIAGNOSIS — M25532 Pain in left wrist: Secondary | ICD-10-CM | POA: Diagnosis not present

## 2020-06-23 DIAGNOSIS — W010XXA Fall on same level from slipping, tripping and stumbling without subsequent striking against object, initial encounter: Secondary | ICD-10-CM | POA: Insufficient documentation

## 2020-06-23 MED ORDER — HYDROCODONE-ACETAMINOPHEN 5-325 MG PO TABS
1.0000 | ORAL_TABLET | Freq: Four times a day (QID) | ORAL | 0 refills | Status: DC | PRN
Start: 2020-06-23 — End: 2020-11-27

## 2020-06-23 MED ORDER — LIDOCAINE HCL 2 % IJ SOLN
20.0000 mL | Freq: Once | INTRAMUSCULAR | Status: AC
  Administered 2020-06-23: 400 mg
  Filled 2020-06-23: qty 20

## 2020-06-23 MED ORDER — HYDROCODONE-ACETAMINOPHEN 5-325 MG PO TABS
1.0000 | ORAL_TABLET | Freq: Once | ORAL | Status: AC
  Administered 2020-06-23: 1 via ORAL
  Filled 2020-06-23: qty 1

## 2020-06-23 NOTE — ED Notes (Signed)
Patient assisted with sliding up in bed. Sleeve cut to left wrist due to swelling

## 2020-06-23 NOTE — ED Provider Notes (Signed)
[facility] EMERGENCY DEPARTMENT Provider Note  CSN: [id number] Arrival date & time: 06/23/20 1856    History Chief Complaint  Patient presents with  . Fall    HPI  Katherine Heath is a 69 y.o. female reports she was walking in her yard this morning feeding some birds when she tripped and fell onto outstretched L hand, injuring her L wrist. Denies any other injuries. Complaining of moderate to severe aching pain worse with movement.   Past Medical History:  Diagnosis Date  . Arthritis   . Depression   . GERD (gastroesophageal reflux disease)   . Hyperlipidemia 06/17/2017  . Shortness of breath 11/23/2015    Past Surgical History:  Procedure Laterality Date  . sphincterotomy  2003    Family History  Problem Relation Age of Onset  . Heart disease Father   . Heart attack Father   . Heart disease Maternal Grandmother   . Hypertension Maternal Grandmother   . Heart disease Paternal Grandfather   . Stroke Mother   . Stroke Maternal Grandfather     Social History   Tobacco Use  . Smoking status: Never Smoker  . Smokeless tobacco: Never Used  Vaping Use  . Vaping Use: Never used  Substance Use Topics  . Alcohol use: Not Currently    Alcohol/week: 0.0 standard drinks  . Drug use: No     Home Medications Prior to Admission medications   Medication Sig Start Date End Date Taking? Authorizing Provider  BLACK COHOSH EXTRACT PO Take 1 tablet by mouth.     [provider]  Cholecalciferol (VITAMIN D-3 PO) Take by mouth daily. 6000 iu daily    [provider]  ELDERBERRY PO Take 3 tablets by mouth.    [provider]  HYDROcodone-acetaminophen (NORCO/VICODIN) 5-325 MG tablet Take 1 tablet by mouth every 6 (six) hours as needed for severe pain. 06/23/20   Truddie Hidden, MD  magnesium oxide (MAG-OX) 400 MG tablet Take 400 mg by mouth daily.    [provider]  NON FORMULARY DIM    [provider]  VITAMIN K PO Take by  mouth.    [provider]     Allergies    Latex   Review of Systems   Review of Systems A comprehensive review of systems was completed and negative except as noted in HPI.    Physical Exam BP 130/67 (BP Location: Right Arm)   Pulse (!) 57   Temp (!) 97.4 F (36.3 C) (Oral)   Resp 18   SpO2 96%   Physical Exam Vitals and nursing note reviewed.  Constitutional:      Appearance: Normal appearance.  HENT:     Head: Normocephalic and atraumatic.     Nose: Nose normal.     Mouth/Throat:     Mouth: Mucous membranes are moist.  Eyes:     Extraocular Movements: Extraocular movements intact.     Conjunctiva/sclera: Conjunctivae normal.  Cardiovascular:     Rate and Rhythm: Normal rate.  Pulmonary:     Effort: Pulmonary effort is normal.     Breath sounds: Normal breath sounds.  Abdominal:     General: Abdomen is flat.     Palpations: Abdomen is soft.     Tenderness: There is no abdominal tenderness.  Musculoskeletal:        General: Swelling and tenderness present.     Cervical back: Neck supple.     Comments: Tenderness and swelling, mostly over the  radial wrist, decreased ROM due to pain, NVI  Skin:    General: Skin is warm and dry.  Neurological:     General: No focal deficit present.     Mental Status: She is alert.  Psychiatric:        Mood and Affect: Mood normal.      ED Results / Procedures / Treatments   Labs (all labs ordered are listed, but only abnormal results are displayed) Labs Reviewed - No data to display  EKG None  Radiology DG Wrist Complete Left  Result Date: 06/23/2020 CLINICAL DATA:  Fall.  Wrist pain. EXAM: LEFT WRIST - COMPLETE 3+ VIEW COMPARISON:  None FINDINGS: Comminuted, intra-articular impacted fracture of the distal radius is identified. There is mild dorsal angulation of the distal fracture fragments. Mildly displaced ulnar styloid fracture is also identified. IMPRESSION: 1. Comminuted, intra-articular impacted  fracture of the distal radius with mild dorsal angulation. 2. Mildly displaced ulnar styloid fracture. Electronically Signed   By: Kerby Moors M.D.   On: 06/23/2020 09:40    Procedures .Ortho Injury Treatment  Date/Time: 06/23/2020 9:50 AM Performed by: Truddie Hidden, MD Authorized by: Truddie Hidden, MD   Consent:    Consent obtained:  Verbal   Consent given by:  PatientInjury location: wrist Location details: left wrist Injury type: fracture Fracture type: distal radius and ulnar styloid Pre-procedure neurovascular assessment: neurovascularly intact Anesthesia: hematoma block  Anesthesia: Local anesthesia used: yes Local Anesthetic: lidocaine 2% without epinephrine Anesthetic total: 10 mL  Patient sedated: NoImmobilization: splint Splint type: sugar tong Supplies used: Ortho-Glass Post-procedure neurovascular assessment: post-procedure neurovascularly intact Post-procedure distal perfusion: normal Post-procedure neurological function: normal     Medications Ordered in the ED Medications  HYDROcodone-acetaminophen (NORCO/VICODIN) 5-325 MG per tablet 1 tablet (1 tablet Oral Given 06/23/20 0912)  lidocaine (XYLOCAINE) 2 % (with pres) injection 400 mg (400 mg Other Given 06/23/20 0917)     MDM Rules/Calculators/A&P MDM Reviewed Xray imaging, distal radius and ulnar styloid fx. Will discuss plan with Ortho/Hand on call.  ED Course  I have reviewed the triage vital signs and the nursing notes.  Pertinent labs & imaging results that were available during my care of the patient were reviewed by me and considered in my medical decision making (see chart for details).  Clinical Course as of Jun 24 1047  Nancy Fetter Jun 23, 2020  [id number] Spoke with Dr. Mardelle Matte, Ortho, who has reviewed films, recommends splinting and follow up in his office. Will also do hematoma block for pain control.    [CS]    Clinical Course User Index [CS] Truddie Hidden, MD    Final Clinical  Impression(s) / ED Diagnoses Final diagnoses:  Other closed intra-articular fracture of distal end of left radius, initial encounter    Rx / DC Orders ED Discharge Orders         Ordered    HYDROcodone-acetaminophen (NORCO/VICODIN) 5-325 MG tablet  Every 6 hours PRN        06/23/20 1048           Truddie Hidden, MD 06/23/20 1048

## 2020-06-23 NOTE — ED Triage Notes (Signed)
Pt presents with c/o fall and left wrist injury. Pt reports she fell, catching herself with that wrist.

## 2020-06-23 NOTE — ED Notes (Signed)
MD applied splint

## 2020-06-23 NOTE — ED Notes (Addendum)
secretary aware to call ortho tech for short arm splint. Ortho tech will be in at 11am

## 2020-06-26 DIAGNOSIS — M25532 Pain in left wrist: Secondary | ICD-10-CM | POA: Diagnosis not present

## 2020-06-27 ENCOUNTER — Ambulatory Visit: Payer: Medicare Other | Admitting: Sports Medicine

## 2020-06-27 DIAGNOSIS — S52572A Other intraarticular fracture of lower end of left radius, initial encounter for closed fracture: Secondary | ICD-10-CM | POA: Diagnosis not present

## 2020-06-27 DIAGNOSIS — G8918 Other acute postprocedural pain: Secondary | ICD-10-CM | POA: Diagnosis not present

## 2020-06-27 DIAGNOSIS — Y999 Unspecified external cause status: Secondary | ICD-10-CM | POA: Diagnosis not present

## 2020-06-27 DIAGNOSIS — X58XXXA Exposure to other specified factors, initial encounter: Secondary | ICD-10-CM | POA: Diagnosis not present

## 2020-06-27 DIAGNOSIS — S52502A Unspecified fracture of the lower end of left radius, initial encounter for closed fracture: Secondary | ICD-10-CM | POA: Diagnosis not present

## 2020-06-30 ENCOUNTER — Encounter: Payer: Self-pay | Admitting: Internal Medicine

## 2020-07-08 DIAGNOSIS — S52502D Unspecified fracture of the lower end of left radius, subsequent encounter for closed fracture with routine healing: Secondary | ICD-10-CM | POA: Diagnosis not present

## 2020-07-23 DIAGNOSIS — Z23 Encounter for immunization: Secondary | ICD-10-CM | POA: Diagnosis not present

## 2020-08-01 ENCOUNTER — Ambulatory Visit (INDEPENDENT_AMBULATORY_CARE_PROVIDER_SITE_OTHER): Payer: Medicare Other | Admitting: Sports Medicine

## 2020-08-01 ENCOUNTER — Other Ambulatory Visit: Payer: Self-pay

## 2020-08-01 ENCOUNTER — Encounter: Payer: Self-pay | Admitting: Sports Medicine

## 2020-08-01 DIAGNOSIS — R194 Change in bowel habit: Secondary | ICD-10-CM | POA: Insufficient documentation

## 2020-08-01 DIAGNOSIS — M24271 Disorder of ligament, right ankle: Secondary | ICD-10-CM

## 2020-08-01 DIAGNOSIS — K625 Hemorrhage of anus and rectum: Secondary | ICD-10-CM | POA: Insufficient documentation

## 2020-08-01 DIAGNOSIS — R2241 Localized swelling, mass and lump, right lower limb: Secondary | ICD-10-CM | POA: Insufficient documentation

## 2020-08-01 DIAGNOSIS — K219 Gastro-esophageal reflux disease without esophagitis: Secondary | ICD-10-CM | POA: Insufficient documentation

## 2020-08-01 DIAGNOSIS — M79671 Pain in right foot: Secondary | ICD-10-CM | POA: Diagnosis not present

## 2020-08-01 DIAGNOSIS — G8929 Other chronic pain: Secondary | ICD-10-CM | POA: Diagnosis not present

## 2020-08-01 DIAGNOSIS — Z79899 Other long term (current) drug therapy: Secondary | ICD-10-CM | POA: Insufficient documentation

## 2020-08-01 DIAGNOSIS — R197 Diarrhea, unspecified: Secondary | ICD-10-CM | POA: Insufficient documentation

## 2020-08-01 DIAGNOSIS — N951 Menopausal and female climacteric states: Secondary | ICD-10-CM | POA: Insufficient documentation

## 2020-08-01 DIAGNOSIS — M779 Enthesopathy, unspecified: Secondary | ICD-10-CM

## 2020-08-01 DIAGNOSIS — K580 Irritable bowel syndrome with diarrhea: Secondary | ICD-10-CM | POA: Insufficient documentation

## 2020-08-01 DIAGNOSIS — M25571 Pain in right ankle and joints of right foot: Secondary | ICD-10-CM | POA: Diagnosis not present

## 2020-08-01 DIAGNOSIS — M19079 Primary osteoarthritis, unspecified ankle and foot: Secondary | ICD-10-CM | POA: Diagnosis not present

## 2020-08-01 DIAGNOSIS — Z1211 Encounter for screening for malignant neoplasm of colon: Secondary | ICD-10-CM | POA: Insufficient documentation

## 2020-08-01 NOTE — Progress Notes (Signed)
Subjective: Katherine Heath is a 69 y.o. female patient who returns for follow-up evaluation of right foot pain.  Patient reports that on the foot where I gave her the injection it seems to help but the pain up around her ankle seems to be getting worse and reports that with the brace that I gave her last visit the pain got worse so she had to stop wearing it and went back to using an over-the-counter compression sleeve which still does not help.  No other pedal complaints noted.   Patient Active Problem List   Diagnosis Date Noted  . Change in bowel habit 08/01/2020  . Colon cancer screening 08/01/2020  . Diarrhea 08/01/2020  . Female climacteric state 08/01/2020  . Gastroesophageal reflux disease 08/01/2020  . 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 Localized swelling, mass and lump, right lower limb 08/01/2020  . Rectal bleeding 08/01/2020  . Hyperlipidemia 06/17/2017  . Exhaustion 05/26/2016  . Shortness of breath 11/23/2015  . Other fatigue 11/23/2015    Current Outpatient Medications on File Prior to Visit  Medication Sig Dispense Refill  . BLACK COHOSH EXTRACT PO Take 1 tablet by mouth.     . Cholecalciferol (VITAMIN D-3 PO) Take by mouth daily. 6000 iu daily    . ELDERBERRY PO Take 3 tablets by mouth.    Marland Kitchen HYDROcodone-acetaminophen (NORCO/VICODIN) 5-325 MG tablet Take 1 tablet by mouth every 6 (six) hours as needed for severe pain. 12 tablet 0  . magnesium oxide (MAG-OX) 400 MG tablet Take 400 mg by mouth daily.    . NON FORMULARY DIM    . ondansetron (ZOFRAN-ODT) 4 MG disintegrating tablet Take 4 mg by mouth every 6 (six) hours as needed.    Marland Kitchen SHINGRIX injection     . VITAMIN K PO Take by mouth.     No current facility-administered medications on file prior to visit.    Allergies  Allergen Reactions  . Latex Itching    Objective:  General: Alert and oriented x3 in no acute distress  Dermatology: No open lesions bilateral lower extremities, no webspace  macerations, no ecchymosis bilateral, all nails x 10 are well manicured.  Vascular: Dorsalis Pedis and Posterior Tibial pedal pulses palpable, Capillary Fill Time 5 seconds,(+) pedal hair growth bilateral, mild edema noted to the lateral ankle on the right, significant varicosities noted, Temperature gradient within normal limits.  Neurology: Gross sensation intact via light touch bilateral.  Musculoskeletal: Mild to moderate tenderness with palpation at peroneal tendon insertion and to sinus tarsi at lateral ankle on right worse today at the ATFL ligament on the right, pain worsens with plantarflexion and inversion of right foot, tailors bunion deformity noted, Strength within normal limits in all groups bilateral.    Assessment and Plan: Problem List Items Addressed This Visit   None   Visit Diagnoses    Pain in right foot    -  Primary   Relevant Orders   MR ANKLE RIGHT WO CONTRAST   Tendonitis       Relevant Orders   MR ANKLE RIGHT WO CONTRAST   Arthritis of foot       Relevant Orders   MR ANKLE RIGHT WO CONTRAST   Sinus tarsi syndrome of right foot       Relevant Orders   MR ANKLE RIGHT WO CONTRAST   Capsulitis       Chronic pain of right ankle       Relevant Orders   MR ANKLE RIGHT  WO CONTRAST   Disorder of ligament of right ankle       Relevant Orders   MR ANKLE RIGHT WO CONTRAST      -Complete examination performed -Discussed treatment options for ongoing foot and ankle pain -Recommend rest ice elevation and over-the-counter anti-inflammatories -Advised patient that we will proceed with ordering a MRI since his pain has been going on for several months since May with no improvement and failure of self physical therapy, injection, foot brace, and good supportive shoes -Advised trilock to use as instructed since ankle gaunlet not effective -Advised rest ice elevation or heat as needed -Advised to limit activities until after MRI review -Patient to return to office  after MRI or sooner if condition worsens.  Landis Martins, DPM

## 2020-08-05 DIAGNOSIS — S52502D Unspecified fracture of the lower end of left radius, subsequent encounter for closed fracture with routine healing: Secondary | ICD-10-CM | POA: Diagnosis not present

## 2020-08-12 ENCOUNTER — Other Ambulatory Visit: Payer: Self-pay

## 2020-08-12 ENCOUNTER — Ambulatory Visit
Admission: RE | Admit: 2020-08-12 | Discharge: 2020-08-12 | Disposition: A | Discharge status: Home / Self Care | Payer: Medicare Other | Source: Ambulatory Visit | Attending: Sports Medicine | Admitting: Sports Medicine

## 2020-08-12 DIAGNOSIS — M19071 Primary osteoarthritis, right ankle and foot: Secondary | ICD-10-CM | POA: Diagnosis not present

## 2020-08-12 DIAGNOSIS — M25571 Pain in right ankle and joints of right foot: Secondary | ICD-10-CM

## 2020-08-12 DIAGNOSIS — M19079 Primary osteoarthritis, unspecified ankle and foot: Secondary | ICD-10-CM

## 2020-08-12 DIAGNOSIS — S86311A Strain of muscle(s) and tendon(s) of peroneal muscle group at lower leg level, right leg, initial encounter: Secondary | ICD-10-CM | POA: Diagnosis not present

## 2020-08-12 DIAGNOSIS — M79671 Pain in right foot: Secondary | ICD-10-CM

## 2020-08-12 DIAGNOSIS — G8929 Other chronic pain: Secondary | ICD-10-CM

## 2020-08-12 DIAGNOSIS — M24271 Disorder of ligament, right ankle: Secondary | ICD-10-CM

## 2020-08-12 DIAGNOSIS — R600 Localized edema: Secondary | ICD-10-CM | POA: Diagnosis not present

## 2020-08-12 DIAGNOSIS — M779 Enthesopathy, unspecified: Secondary | ICD-10-CM

## 2020-08-13 ENCOUNTER — Telehealth: Payer: Self-pay | Admitting: Sports Medicine

## 2020-08-13 NOTE — Telephone Encounter (Signed)
Pt was instructed to call Dr. Marylene Land when she got her MRI done. MRI was done 08/12/2020.

## 2020-08-13 NOTE — Telephone Encounter (Signed)
She needs a virtual or an in person appt to discuss the results with me Thanks Dr. Marylene Land

## 2020-08-14 DIAGNOSIS — S52502D Unspecified fracture of the lower end of left radius, subsequent encounter for closed fracture with routine healing: Secondary | ICD-10-CM | POA: Diagnosis not present

## 2020-08-14 NOTE — Telephone Encounter (Signed)
Thanks

## 2020-08-14 NOTE — Telephone Encounter (Signed)
Scheduled for 08/22/2020 @ 8:00am

## 2020-08-22 ENCOUNTER — Telehealth (INDEPENDENT_AMBULATORY_CARE_PROVIDER_SITE_OTHER): Payer: Medicare Other | Admitting: Sports Medicine

## 2020-08-22 ENCOUNTER — Encounter: Payer: Self-pay | Admitting: Sports Medicine

## 2020-08-22 ENCOUNTER — Other Ambulatory Visit: Payer: Self-pay

## 2020-08-22 DIAGNOSIS — S96911S Strain of unspecified muscle and tendon at ankle and foot level, right foot, sequela: Secondary | ICD-10-CM | POA: Diagnosis not present

## 2020-08-22 DIAGNOSIS — M79671 Pain in right foot: Secondary | ICD-10-CM

## 2020-08-22 DIAGNOSIS — M25571 Pain in right ankle and joints of right foot: Secondary | ICD-10-CM

## 2020-08-22 DIAGNOSIS — G8929 Other chronic pain: Secondary | ICD-10-CM

## 2020-08-22 NOTE — Progress Notes (Signed)
Virtual Visit via Telephone Note  I connected with Katherine Heath on 08/22/20 at  8:00 AM EST by telephone and verified that I am speaking with the correct person using two identifiers.  Location: Patient: Katherine Heath (home)  Provider: Asencion Islam, DPM ([facility])     I discussed the limitations, risks, security and privacy concerns of performing an evaluation and management service by telephone and the availability of in person appointments. I also discussed with the patient that there may be a patient responsible charge related to this service. The patient expressed understanding and agreed to proceed.   History of Present Illness: Patient reports her pain is the same at right ankle. Using ankle brace, rest, and tennis shoes. Still has pain with activity. Patient is also awaiting MRI results.    Observations/Objective: Physical exam unable to be performed due to telephone nature of visit  Assessment and Plan: Problem List Items Addressed This Visit   None   Visit Diagnoses    Tear of tendon of right ankle, sequela    -  Primary   Relevant Orders   Ambulatory referral to Physical Therapy   Pain in right foot       Chronic pain of right ankle         MRI results reviewed consistent with tear at peroneal tendons Patient elects for PT at this time with the understanding that her tears may not improve and may require surgery Continue with brace Avoid strenous activity  Recommend rest, ice, elevation, and OTC NSAIDs as needed Rx for [facility]  Follow Up Instructions: 1 month after starting PT   I discussed the assessment and treatment plan with the patient. The patient was provided an opportunity to ask questions and all were answered. The patient agreed with the plan and demonstrated an understanding of the instructions.   The patient was advised to call back or seek an in-person evaluation if the symptoms worsen or if the condition  fails to improve as anticipated.  I provided 17 minutes of non-face-to-face time during this encounter.   Asencion Islam, DPM

## 2020-08-26 DIAGNOSIS — R262 Difficulty in walking, not elsewhere classified: Secondary | ICD-10-CM | POA: Diagnosis not present

## 2020-08-26 DIAGNOSIS — M7671 Peroneal tendinitis, right leg: Secondary | ICD-10-CM | POA: Diagnosis not present

## 2020-08-26 DIAGNOSIS — M25571 Pain in right ankle and joints of right foot: Secondary | ICD-10-CM | POA: Diagnosis not present

## 2020-08-26 DIAGNOSIS — M25671 Stiffness of right ankle, not elsewhere classified: Secondary | ICD-10-CM | POA: Diagnosis not present

## 2020-08-26 DIAGNOSIS — R531 Weakness: Secondary | ICD-10-CM | POA: Diagnosis not present

## 2020-08-27 DIAGNOSIS — M25571 Pain in right ankle and joints of right foot: Secondary | ICD-10-CM | POA: Diagnosis not present

## 2020-08-27 DIAGNOSIS — M7671 Peroneal tendinitis, right leg: Secondary | ICD-10-CM | POA: Diagnosis not present

## 2020-08-27 DIAGNOSIS — M25671 Stiffness of right ankle, not elsewhere classified: Secondary | ICD-10-CM | POA: Diagnosis not present

## 2020-08-27 DIAGNOSIS — R262 Difficulty in walking, not elsewhere classified: Secondary | ICD-10-CM | POA: Diagnosis not present

## 2020-08-27 DIAGNOSIS — R531 Weakness: Secondary | ICD-10-CM | POA: Diagnosis not present

## 2020-08-28 DIAGNOSIS — M7671 Peroneal tendinitis, right leg: Secondary | ICD-10-CM | POA: Diagnosis not present

## 2020-08-28 DIAGNOSIS — R262 Difficulty in walking, not elsewhere classified: Secondary | ICD-10-CM | POA: Diagnosis not present

## 2020-08-28 DIAGNOSIS — M25671 Stiffness of right ankle, not elsewhere classified: Secondary | ICD-10-CM | POA: Diagnosis not present

## 2020-08-28 DIAGNOSIS — R531 Weakness: Secondary | ICD-10-CM | POA: Diagnosis not present

## 2020-08-28 DIAGNOSIS — M25571 Pain in right ankle and joints of right foot: Secondary | ICD-10-CM | POA: Diagnosis not present

## 2020-08-29 ENCOUNTER — Other Ambulatory Visit: Payer: Self-pay | Admitting: Sports Medicine

## 2020-08-29 DIAGNOSIS — S96911S Strain of unspecified muscle and tendon at ankle and foot level, right foot, sequela: Secondary | ICD-10-CM

## 2020-08-30 DIAGNOSIS — R262 Difficulty in walking, not elsewhere classified: Secondary | ICD-10-CM | POA: Diagnosis not present

## 2020-08-30 DIAGNOSIS — R531 Weakness: Secondary | ICD-10-CM | POA: Diagnosis not present

## 2020-08-30 DIAGNOSIS — M25571 Pain in right ankle and joints of right foot: Secondary | ICD-10-CM | POA: Diagnosis not present

## 2020-08-30 DIAGNOSIS — M25671 Stiffness of right ankle, not elsewhere classified: Secondary | ICD-10-CM | POA: Diagnosis not present

## 2020-08-30 DIAGNOSIS — M7671 Peroneal tendinitis, right leg: Secondary | ICD-10-CM | POA: Diagnosis not present

## 2020-09-04 DIAGNOSIS — R262 Difficulty in walking, not elsewhere classified: Secondary | ICD-10-CM | POA: Diagnosis not present

## 2020-09-04 DIAGNOSIS — M7671 Peroneal tendinitis, right leg: Secondary | ICD-10-CM | POA: Diagnosis not present

## 2020-09-04 DIAGNOSIS — M25571 Pain in right ankle and joints of right foot: Secondary | ICD-10-CM | POA: Diagnosis not present

## 2020-09-04 DIAGNOSIS — R531 Weakness: Secondary | ICD-10-CM | POA: Diagnosis not present

## 2020-09-04 DIAGNOSIS — M25671 Stiffness of right ankle, not elsewhere classified: Secondary | ICD-10-CM | POA: Diagnosis not present

## 2020-09-05 DIAGNOSIS — M25571 Pain in right ankle and joints of right foot: Secondary | ICD-10-CM | POA: Diagnosis not present

## 2020-09-05 DIAGNOSIS — M25671 Stiffness of right ankle, not elsewhere classified: Secondary | ICD-10-CM | POA: Diagnosis not present

## 2020-09-05 DIAGNOSIS — R531 Weakness: Secondary | ICD-10-CM | POA: Diagnosis not present

## 2020-09-05 DIAGNOSIS — R262 Difficulty in walking, not elsewhere classified: Secondary | ICD-10-CM | POA: Diagnosis not present

## 2020-09-05 DIAGNOSIS — M7671 Peroneal tendinitis, right leg: Secondary | ICD-10-CM | POA: Diagnosis not present

## 2020-09-06 DIAGNOSIS — M7671 Peroneal tendinitis, right leg: Secondary | ICD-10-CM | POA: Diagnosis not present

## 2020-09-06 DIAGNOSIS — R531 Weakness: Secondary | ICD-10-CM | POA: Diagnosis not present

## 2020-09-06 DIAGNOSIS — M25671 Stiffness of right ankle, not elsewhere classified: Secondary | ICD-10-CM | POA: Diagnosis not present

## 2020-09-06 DIAGNOSIS — R262 Difficulty in walking, not elsewhere classified: Secondary | ICD-10-CM | POA: Diagnosis not present

## 2020-09-06 DIAGNOSIS — M25571 Pain in right ankle and joints of right foot: Secondary | ICD-10-CM | POA: Diagnosis not present

## 2020-09-09 DIAGNOSIS — M25571 Pain in right ankle and joints of right foot: Secondary | ICD-10-CM | POA: Diagnosis not present

## 2020-09-09 DIAGNOSIS — R531 Weakness: Secondary | ICD-10-CM | POA: Diagnosis not present

## 2020-09-09 DIAGNOSIS — M7671 Peroneal tendinitis, right leg: Secondary | ICD-10-CM | POA: Diagnosis not present

## 2020-09-09 DIAGNOSIS — R262 Difficulty in walking, not elsewhere classified: Secondary | ICD-10-CM | POA: Diagnosis not present

## 2020-09-09 DIAGNOSIS — M25671 Stiffness of right ankle, not elsewhere classified: Secondary | ICD-10-CM | POA: Diagnosis not present

## 2020-09-11 DIAGNOSIS — M25671 Stiffness of right ankle, not elsewhere classified: Secondary | ICD-10-CM | POA: Diagnosis not present

## 2020-09-11 DIAGNOSIS — M25571 Pain in right ankle and joints of right foot: Secondary | ICD-10-CM | POA: Diagnosis not present

## 2020-09-11 DIAGNOSIS — R262 Difficulty in walking, not elsewhere classified: Secondary | ICD-10-CM | POA: Diagnosis not present

## 2020-09-11 DIAGNOSIS — R531 Weakness: Secondary | ICD-10-CM | POA: Diagnosis not present

## 2020-09-11 DIAGNOSIS — M7671 Peroneal tendinitis, right leg: Secondary | ICD-10-CM | POA: Diagnosis not present

## 2020-09-13 DIAGNOSIS — M25571 Pain in right ankle and joints of right foot: Secondary | ICD-10-CM | POA: Diagnosis not present

## 2020-09-13 DIAGNOSIS — R262 Difficulty in walking, not elsewhere classified: Secondary | ICD-10-CM | POA: Diagnosis not present

## 2020-09-13 DIAGNOSIS — M7671 Peroneal tendinitis, right leg: Secondary | ICD-10-CM | POA: Diagnosis not present

## 2020-09-13 DIAGNOSIS — M25671 Stiffness of right ankle, not elsewhere classified: Secondary | ICD-10-CM | POA: Diagnosis not present

## 2020-09-13 DIAGNOSIS — R531 Weakness: Secondary | ICD-10-CM | POA: Diagnosis not present

## 2020-09-17 DIAGNOSIS — R531 Weakness: Secondary | ICD-10-CM | POA: Diagnosis not present

## 2020-09-17 DIAGNOSIS — M25671 Stiffness of right ankle, not elsewhere classified: Secondary | ICD-10-CM | POA: Diagnosis not present

## 2020-09-17 DIAGNOSIS — M25571 Pain in right ankle and joints of right foot: Secondary | ICD-10-CM | POA: Diagnosis not present

## 2020-09-17 DIAGNOSIS — R262 Difficulty in walking, not elsewhere classified: Secondary | ICD-10-CM | POA: Diagnosis not present

## 2020-09-17 DIAGNOSIS — M7671 Peroneal tendinitis, right leg: Secondary | ICD-10-CM | POA: Diagnosis not present

## 2020-09-18 DIAGNOSIS — M25571 Pain in right ankle and joints of right foot: Secondary | ICD-10-CM | POA: Diagnosis not present

## 2020-09-18 DIAGNOSIS — R531 Weakness: Secondary | ICD-10-CM | POA: Diagnosis not present

## 2020-09-18 DIAGNOSIS — M7671 Peroneal tendinitis, right leg: Secondary | ICD-10-CM | POA: Diagnosis not present

## 2020-09-18 DIAGNOSIS — R262 Difficulty in walking, not elsewhere classified: Secondary | ICD-10-CM | POA: Diagnosis not present

## 2020-09-18 DIAGNOSIS — M25671 Stiffness of right ankle, not elsewhere classified: Secondary | ICD-10-CM | POA: Diagnosis not present

## 2020-09-20 DIAGNOSIS — M25671 Stiffness of right ankle, not elsewhere classified: Secondary | ICD-10-CM | POA: Diagnosis not present

## 2020-09-20 DIAGNOSIS — M7671 Peroneal tendinitis, right leg: Secondary | ICD-10-CM | POA: Diagnosis not present

## 2020-09-20 DIAGNOSIS — M25571 Pain in right ankle and joints of right foot: Secondary | ICD-10-CM | POA: Diagnosis not present

## 2020-09-20 DIAGNOSIS — R531 Weakness: Secondary | ICD-10-CM | POA: Diagnosis not present

## 2020-09-20 DIAGNOSIS — R262 Difficulty in walking, not elsewhere classified: Secondary | ICD-10-CM | POA: Diagnosis not present

## 2020-09-23 DIAGNOSIS — R531 Weakness: Secondary | ICD-10-CM | POA: Diagnosis not present

## 2020-09-23 DIAGNOSIS — M7671 Peroneal tendinitis, right leg: Secondary | ICD-10-CM | POA: Diagnosis not present

## 2020-09-23 DIAGNOSIS — M25671 Stiffness of right ankle, not elsewhere classified: Secondary | ICD-10-CM | POA: Diagnosis not present

## 2020-09-23 DIAGNOSIS — M25571 Pain in right ankle and joints of right foot: Secondary | ICD-10-CM | POA: Diagnosis not present

## 2020-09-23 DIAGNOSIS — R262 Difficulty in walking, not elsewhere classified: Secondary | ICD-10-CM | POA: Diagnosis not present

## 2020-10-03 DIAGNOSIS — M7671 Peroneal tendinitis, right leg: Secondary | ICD-10-CM | POA: Diagnosis not present

## 2020-10-03 DIAGNOSIS — M25571 Pain in right ankle and joints of right foot: Secondary | ICD-10-CM | POA: Diagnosis not present

## 2020-10-03 DIAGNOSIS — R262 Difficulty in walking, not elsewhere classified: Secondary | ICD-10-CM | POA: Diagnosis not present

## 2020-10-03 DIAGNOSIS — M25671 Stiffness of right ankle, not elsewhere classified: Secondary | ICD-10-CM | POA: Diagnosis not present

## 2020-10-03 DIAGNOSIS — R531 Weakness: Secondary | ICD-10-CM | POA: Diagnosis not present

## 2020-10-07 DIAGNOSIS — R262 Difficulty in walking, not elsewhere classified: Secondary | ICD-10-CM | POA: Diagnosis not present

## 2020-10-07 DIAGNOSIS — M25571 Pain in right ankle and joints of right foot: Secondary | ICD-10-CM | POA: Diagnosis not present

## 2020-10-07 DIAGNOSIS — M25671 Stiffness of right ankle, not elsewhere classified: Secondary | ICD-10-CM | POA: Diagnosis not present

## 2020-10-07 DIAGNOSIS — R531 Weakness: Secondary | ICD-10-CM | POA: Diagnosis not present

## 2020-10-07 DIAGNOSIS — M7671 Peroneal tendinitis, right leg: Secondary | ICD-10-CM | POA: Diagnosis not present

## 2020-10-14 DIAGNOSIS — M25671 Stiffness of right ankle, not elsewhere classified: Secondary | ICD-10-CM | POA: Diagnosis not present

## 2020-10-14 DIAGNOSIS — M7671 Peroneal tendinitis, right leg: Secondary | ICD-10-CM | POA: Diagnosis not present

## 2020-10-14 DIAGNOSIS — M25571 Pain in right ankle and joints of right foot: Secondary | ICD-10-CM | POA: Diagnosis not present

## 2020-10-14 DIAGNOSIS — R531 Weakness: Secondary | ICD-10-CM | POA: Diagnosis not present

## 2020-10-14 DIAGNOSIS — R262 Difficulty in walking, not elsewhere classified: Secondary | ICD-10-CM | POA: Diagnosis not present

## 2020-10-25 DIAGNOSIS — R262 Difficulty in walking, not elsewhere classified: Secondary | ICD-10-CM | POA: Diagnosis not present

## 2020-10-25 DIAGNOSIS — R531 Weakness: Secondary | ICD-10-CM | POA: Diagnosis not present

## 2020-10-25 DIAGNOSIS — M7671 Peroneal tendinitis, right leg: Secondary | ICD-10-CM | POA: Diagnosis not present

## 2020-10-25 DIAGNOSIS — M25671 Stiffness of right ankle, not elsewhere classified: Secondary | ICD-10-CM | POA: Diagnosis not present

## 2020-10-25 DIAGNOSIS — M25571 Pain in right ankle and joints of right foot: Secondary | ICD-10-CM | POA: Diagnosis not present

## 2020-10-30 DIAGNOSIS — M7671 Peroneal tendinitis, right leg: Secondary | ICD-10-CM | POA: Diagnosis not present

## 2020-10-30 DIAGNOSIS — R531 Weakness: Secondary | ICD-10-CM | POA: Diagnosis not present

## 2020-10-30 DIAGNOSIS — R262 Difficulty in walking, not elsewhere classified: Secondary | ICD-10-CM | POA: Diagnosis not present

## 2020-10-30 DIAGNOSIS — M25671 Stiffness of right ankle, not elsewhere classified: Secondary | ICD-10-CM | POA: Diagnosis not present

## 2020-10-30 DIAGNOSIS — M25571 Pain in right ankle and joints of right foot: Secondary | ICD-10-CM | POA: Diagnosis not present

## 2020-11-06 DIAGNOSIS — R262 Difficulty in walking, not elsewhere classified: Secondary | ICD-10-CM | POA: Diagnosis not present

## 2020-11-06 DIAGNOSIS — M25671 Stiffness of right ankle, not elsewhere classified: Secondary | ICD-10-CM | POA: Diagnosis not present

## 2020-11-06 DIAGNOSIS — M7671 Peroneal tendinitis, right leg: Secondary | ICD-10-CM | POA: Diagnosis not present

## 2020-11-06 DIAGNOSIS — M25571 Pain in right ankle and joints of right foot: Secondary | ICD-10-CM | POA: Diagnosis not present

## 2020-11-06 DIAGNOSIS — R531 Weakness: Secondary | ICD-10-CM | POA: Diagnosis not present

## 2020-11-13 DIAGNOSIS — M25671 Stiffness of right ankle, not elsewhere classified: Secondary | ICD-10-CM | POA: Diagnosis not present

## 2020-11-13 DIAGNOSIS — R262 Difficulty in walking, not elsewhere classified: Secondary | ICD-10-CM | POA: Diagnosis not present

## 2020-11-13 DIAGNOSIS — M25571 Pain in right ankle and joints of right foot: Secondary | ICD-10-CM | POA: Diagnosis not present

## 2020-11-13 DIAGNOSIS — M7671 Peroneal tendinitis, right leg: Secondary | ICD-10-CM | POA: Diagnosis not present

## 2020-11-13 DIAGNOSIS — R531 Weakness: Secondary | ICD-10-CM | POA: Diagnosis not present

## 2020-11-27 ENCOUNTER — Other Ambulatory Visit: Payer: Self-pay

## 2020-11-27 ENCOUNTER — Ambulatory Visit (INDEPENDENT_AMBULATORY_CARE_PROVIDER_SITE_OTHER): Payer: Medicare Other | Admitting: Internal Medicine

## 2020-11-27 ENCOUNTER — Encounter: Payer: Self-pay | Admitting: Internal Medicine

## 2020-11-27 VITALS — BP 110/62 | HR 69 | Temp 98.4°F | Ht 62.8 in | Wt 147.2 lb

## 2020-11-27 DIAGNOSIS — Z79899 Other long term (current) drug therapy: Secondary | ICD-10-CM

## 2020-11-27 DIAGNOSIS — I700 Atherosclerosis of aorta: Secondary | ICD-10-CM | POA: Diagnosis not present

## 2020-11-27 DIAGNOSIS — E7800 Pure hypercholesterolemia, unspecified: Secondary | ICD-10-CM

## 2020-11-27 DIAGNOSIS — N951 Menopausal and female climacteric states: Secondary | ICD-10-CM

## 2020-11-27 DIAGNOSIS — I890 Lymphedema, not elsewhere classified: Secondary | ICD-10-CM | POA: Diagnosis not present

## 2020-11-27 LAB — CMP14+EGFR
ALT: 16 IU/L (ref 0–32)
AST: 18 IU/L (ref 0–40)
Albumin/Globulin Ratio: 1.8 (ref 1.2–2.2)
Albumin: 4.3 g/dL (ref 3.8–4.8)
Alkaline Phosphatase: 82 IU/L (ref 44–121)
BUN/Creatinine Ratio: 32 — ABNORMAL HIGH (ref 12–28)
BUN: 25 mg/dL (ref 8–27)
Bilirubin Total: <0.2 mg/dL (ref 0.0–1.2)
CO2: 21 mmol/L (ref 20–29)
Calcium: 9.3 mg/dL (ref 8.7–10.3)
Chloride: 100 mmol/L (ref 96–106)
Creatinine, Ser: 0.77 mg/dL (ref 0.57–1.00)
Globulin, Total: 2.4 g/dL (ref 1.5–4.5)
Glucose: 90 mg/dL (ref 65–99)
Potassium: 4.2 mmol/L (ref 3.5–5.2)
Sodium: 137 mmol/L (ref 134–144)
Total Protein: 6.7 g/dL (ref 6.0–8.5)
eGFR: 83 mL/min/{1.73_m2} (ref >59)

## 2020-11-27 NOTE — Patient Instructions (Signed)
Red yeast rice  Red Yeast Rice capsules What is this medicine? RED YEAST RICE (red yeest rahys) is intended to be used by healthy adults to help lower blood cholesterol in conjunction with a healthy diet and a regular exercise program. The FDA has not approved this supplement for any medical use. If medical treatment is needed for cholesterol control or any other disease, you should contact your doctor or health care professional regarding the use of this product. This supplement may be used for other purposes; ask your health care provider or pharmacist if you have questions. This medicine may be used for other purposes; ask your health care provider or pharmacist if you have questions. COMMON BRAND NAME(S): Red Yeast Rice What should I tell my health care provider before I take this medicine? They need to know if you have any of these conditions:  frequently drink alcoholic beverages  kidney disease  liver disease  muscle aches or weakness  other medical condition  an unusual or allergic reaction to red yeast rice, went yeast, lovastatin, other 'statin' medications, other medicines, foods, dyes, or preservatives  pregnant or trying to get pregnant  breast-feeding How should I use this medicine? Take this supplement by mouth with a glass of water. Follow the directions on the package labeling, or take as directed by your health care professional. Do not take this supplement more often than directed. Contact your pediatrician or health care professional regarding the use of this supplement in children. Special care may be needed. This supplement is not recommended for use in children. Overdosage: If you think you have taken too much of this medicine contact a poison control center or emergency room at once. NOTE: This medicine is only for you. Do not share this medicine with others. What if I miss a dose? If you miss a dose, take it as soon as you can. If it is almost time for your  next dose, take only that dose. Do not take double or extra doses. What may interact with this medicine? Do not take this medicine with any of the following medications:  clarithromycin  delavirdine  erythromycin  grapefruit juice  protease inhibitors used to treat HIV infection  medicines for fungal infections like itraconazole, ketoconazole, posaconazole, and voriconazole  mibefradil  nefazodone  other medicines for high cholesterol  telithromycin  troleandomycin This medicine may also interact with the following medications:  alcohol  amiodarone  colchicine  cyclosporine  danazol  diltiazem  fenofibrate  fluconazole  gemfibrozil  mifepristone, RU-486  niacin  St. John's wort  verapamil  voriconazole  warfarin This list may not describe all possible interactions. Give your health care provider a list of all the medicines, herbs, non-prescription drugs, or dietary supplements you use. Also tell them if you smoke, drink alcohol, or use illegal drugs. Some items may interact with your medicine. What should I watch for while using this medicine? Visit your doctor or health care professional for regular check-ups. You may need regular tests to make sure your liver is working properly. Tell you doctor or health care professional right away if you get any unexplained muscle pain, tenderness, or weakness, especially if you also have a fever and tiredness. Some drugs may increase the risk of side effects from this supplement. If you are given certain antibiotics or antifungals, you should stop taking this supplement during those treatments. Check with your doctor or pharmacist for advice. If you are scheduled for any medical or dental procedure, tell your  healthcare provider that you are taking this supplement. You may need to stop taking this supplement before the procedure. Do not use this drug if you are pregnant or breast-feeding. Serious side effects to an  unborn child or to an infant are possible. Talk to your doctor or pharmacist for more information. Herbal or dietary supplements are not regulated like medicines. Rigid quality control standards are not required for dietary supplements. The purity and strength of these products can vary. The safety and effect of this dietary supplement for a certain disease or illness is not well known. This product is not intended to diagnose, treat, cure or prevent any disease. The Food and Drug Administration suggests the following to help consumers protect themselves:  Always read product labels and follow directions.  Natural does not mean a product is safe for humans to take.  Look for products that include USP after the ingredient name. This means that the manufacturer followed the standards of the [place] Pharmacopoeia.  Supplements made or sold by a nationally known food or drug company are more likely to be made under tight controls. You can write to the company for more information about how the product was made. What side effects may I notice from receiving this medicine? Side effects that you should report to your doctor or health care professional as soon as possible:  allergic reactions like skin rash, itching or hives, swelling of the face, lips, or tongue  dark urine  fever  joint pain  muscle cramps, pain  redness, blistering, peeling or loosening of the skin, including inside the mouth  trouble passing urine or change in the amount of urine  unusually weak or tired  yellowing of the eyes or skin Side effects that usually do not require medical attention (report to your doctor or health care professional if they continue or are bothersome):  constipation  headache  stomach gas, pain, upset  nausea  trouble sleeping This list may not describe all possible side effects. Call your doctor for medical advice about side effects. You may report side effects to FDA at  1-800-FDA-1088. Where should I keep my medicine? Keep out of the reach of children. Store at room temperature between 15 and 30 degrees C (59 and 86 degrees F). Throw away any unused medicine after the expiration date. NOTE: This sheet is a summary. It may not cover all possible information. If you have questions about this medicine, talk to your doctor, pharmacist, or health care provider.  2021 Elsevier/Gold Standard (2014-04-10 10:26:14)

## 2020-11-27 NOTE — Progress Notes (Signed)
I,Katawbba Wiggins,acting as a Education administrator for [name], MD.,have documented all relevant documentation on the behalf of [name], MD,as directed by  [name], MD while in the presence of [name], MD.  This visit occurred during the SARS-CoV-2 public health emergency.  Safety protocols were in place, including screening questions prior to the visit, additional usage of staff PPE, and extensive cleaning of exam room while observing appropriate contact time as indicated for disinfecting solutions.  Subjective:     Patient ID: Katherine Heath , female    DOB: [birth year] , 70 y.o.   MRN: [id number]   Chief Complaint  Patient presents with  . Hormones f/u    HPI  She is here today for f/u BHRT. She has been off of BHRT since Dec 2020, so she would like to discuss other issues. She would like to see specialist for lymphedema. She has chronic RLE, but feels her left leg could be getting worse.     Past Medical History:  Diagnosis Date  . Arthritis   . Depression   . GERD (gastroesophageal reflux disease)   . Hyperlipidemia 06/17/2017  . Shortness of breath 11/23/2015     Family History  Problem Relation Age of Onset  . Heart disease Father   . Heart attack Father   . Heart disease Maternal Grandmother   . Hypertension Maternal Grandmother   . Heart disease Paternal Grandfather   . Stroke Mother   . Stroke Maternal Grandfather      Current Outpatient Medications:  .  BLACK COHOSH EXTRACT PO, Take 1 tablet by mouth. 2 per day, Disp: , Rfl:  .  Cholecalciferol (VITAMIN D-3 PO), Take by mouth daily. 6000 iu daily, Disp: , Rfl:  .  ELDERBERRY PO, Take 3 tablets by mouth., Disp: , Rfl:  .  magnesium oxide (MAG-OX) 400 MG tablet, Take 400 mg by mouth daily., Disp: , Rfl:  .  NON FORMULARY, DIM, Disp: , Rfl:  .  VITAMIN K PO, Take by mouth., Disp: , Rfl:    Allergies  Allergen Reactions  . Latex Itching     Review of Systems  Constitutional: Negative.    Respiratory: Negative.   Cardiovascular: Positive for leg swelling.  Gastrointestinal: Negative.   Psychiatric/Behavioral: Negative.   All other systems reviewed and are negative.    Today's Vitals   11/27/20 1137  BP: 110/62  Pulse: 69  Temp: 98.4 F (36.9 C)  TempSrc: Oral  Weight: 147 lb 3.2 oz (66.8 kg)  Height: 5' 2.8" (1.595 m)  PainSc: 1   PainLoc: Knee   Body mass index is 26.24 kg/m.  Wt Readings from Last 3 Encounters:  11/27/20 147 lb 3.2 oz (66.8 kg)  05/29/20 144 lb (65.3 kg)  05/29/20 144 lb 3.2 oz (65.4 kg)   Objective:  Physical Exam Vitals and nursing note reviewed.  Constitutional:      Appearance: Normal appearance.  HENT:     Head: Normocephalic and atraumatic.     Nose:     Comments: Masked     Mouth/Throat:     Comments: Masked  Cardiovascular:     Rate and Rhythm: Normal rate and regular rhythm.     Heart sounds: Normal heart sounds.  Pulmonary:     Effort: Pulmonary effort is normal.     Breath sounds: Normal breath sounds.  Musculoskeletal:     Right lower leg: Edema present.  Skin:    General: Skin is warm.  Neurological:     General: No focal deficit present.     Mental Status: She is alert.  Psychiatric:        Mood and Affect: Mood normal.        Behavior: Behavior normal.         Assessment And Plan:     1. Postmenopausal disorder Comments: She is stable off of BHRT therapy.   2. Lymphedema Comments: She agrees to PT - pt advised she may have to travel to [place] or [place]. She does not have issue with this.  - Ambulatory referral to Physical Therapy  3. Pure hypercholesterolemia Comments: Most recent labs reviewed, 150 in Oct 2021. She does not wish to take rx meds. Prefers to work on diet/exercise. Encouraged to take fiber supplement as well.   4. Aortic atherosclerosis ([place]) Comments: Importance of living a heart healthy lifestyle was d/w patient. She is encouraged to follow Mediterranean diet exercise  regularly and practice stress mgmt.  5. Drug therapy - CMP14+EGFR     Patient was given opportunity to ask questions. Patient verbalized understanding of the plan and was able to repeat key elements of the plan. All questions were answered to their satisfaction.   I, [name], MD, have reviewed all documentation for this visit. The documentation on 12/02/20 for the exam, diagnosis, procedures, and orders are all accurate and complete.   IF YOU HAVE BEEN REFERRED TO A SPECIALIST, IT MAY TAKE 1-2 WEEKS TO SCHEDULE/PROCESS THE REFERRAL. IF YOU HAVE NOT HEARD FROM US/SPECIALIST IN TWO WEEKS, PLEASE GIVE [place] A CALL AT [phone] X [phone].   THE PATIENT IS ENCOURAGED TO PRACTICE SOCIAL DISTANCING DUE TO THE COVID-19 PANDEMIC.

## 2020-12-02 DIAGNOSIS — I890 Lymphedema, not elsewhere classified: Secondary | ICD-10-CM | POA: Insufficient documentation

## 2020-12-30 ENCOUNTER — Encounter (HOSPITAL_COMMUNITY): Payer: Self-pay | Admitting: Physical Therapy

## 2020-12-30 ENCOUNTER — Other Ambulatory Visit: Payer: Self-pay

## 2020-12-30 ENCOUNTER — Ambulatory Visit (HOSPITAL_COMMUNITY): Payer: Medicare Other | Attending: Internal Medicine | Admitting: Physical Therapy

## 2020-12-30 DIAGNOSIS — R600 Localized edema: Secondary | ICD-10-CM | POA: Insufficient documentation

## 2020-12-30 NOTE — Therapy (Signed)
[facility] [address] [place], [place], [address] Phone: [phone]   Fax:  [phone]  Physical Therapy Evaluation  Patient Details  Name: Katherine Heath MRN: [id number] Date of Birth: 07-16-1951 Referring Provider (PT): [name]   Encounter Date: 12/30/2020   PT End of Session - 12/30/20 0941    Visit Number 1    Number of Visits 2    Date for PT Re-Evaluation 01/10/21    Authorization Type [employer]    PT Start Time 0830    PT Stop Time 0930    PT Time Calculation (min) 60 min           Past Medical History:  Diagnosis Date  . Arthritis   . Depression   . GERD (gastroesophageal reflux disease)   . Hyperlipidemia 06/17/2017  . Shortness of breath 11/23/2015    Past Surgical History:  Procedure Laterality Date  . sphincterotomy  2003    There were no vitals filed for this visit.    Subjective Assessment - 12/30/20 0841    Subjective Katherine Heath is a 70 yo female who states that she has had pockets of fluid and she has had swelling in her legs for several years.  She was diagnosed with lymphedema in April.  She has tried compressioon garments but they just seemed to push the swelling to her knees,  She has been elevating her legs but this does not seem to make a difference.    Pertinent History , Rt plantar fascitis              [facility] PT Assessment - 12/30/20 0001      Assessment   Medical Diagnosis Lymphedema    Referring Provider (PT) [name]    Onset Date/Surgical Date --   chronic   Next MD Visit 05/18/2021    Prior Therapy none for this episode      Precautions   Precautions --   cellulitis     Restrictions   Weight Bearing Restrictions No      Balance Screen   Has the patient fallen in the past 6 months Yes    How many times? 1    Has the patient had a decrease in activity level because of a fear of falling?  No      Home Ecologist residence      Prior  Function   Level of Independence Independent    Vocation Retired      Associate Professor   Overall Cognitive Status Within Functional Limits for tasks assessed             LYMPHEDEMA/ONCOLOGY QUESTIONNAIRE - 12/30/20 0001      What other symptoms do you have   Are you Having Heaviness or Tightness No    Are you having Pain No    Are you having pitting edema Yes    Body Site LE    Is it Hard or Difficult finding clothes that fit No    Do you have infections No      Lymphedema Stage   Stage STAGE 2 SPONTANEOUSLY IRREVERSIBLE      Lymphedema Assessments   Lymphedema Assessments Lower extremities      Right Lower Extremity Lymphedema   20 cm Proximal to Suprapatella 54.3 cm    10 cm Proximal to Suprapatella 46.7 cm    At Midpatella/Popliteal Crease 38 cm    30 cm Proximal to Floor at Lateral Plantar  Foot 37 cm    20 cm Proximal to Floor at Lateral Plantar Foot 29.2 1    10  cm Proximal to Floor at Lateral Malleoli 21.7 cm      Left Lower Extremity Lymphedema   30 cm Proximal to Suprapatella 51 cm    20 cm Proximal to Suprapatella 44 cm    10 cm Proximal to Suprapatella 41 cm    At Midpatella/Popliteal Crease 36.8 cm    30 cm Proximal to Floor at Lateral Plantar Foot 29.7 cm                   Objective measurements completed on examination: See above findings.     ankle pump, LAQ, hip ab/adduction, marching, diaphragm breathing and lymph squeeze   Instructed in self manual techniques to decrease swelling           PT Education - 12/30/20 0925    Education Details The fact that the pt most likely does not have lymphedema, that her swelling is most likely from her varicose veins, The fact that thigh high compression garments and a compression pump will assist with the swelling and assist in preventing any wounds, cellulitis and lymphedema.  How to use a Melina Copa to don compression garment, LE exercises to promote circulation and what a pump is and does.     Person(s) Educated Patient    Methods Explanation;Handout;Tactile cues;Verbal cues;Demonstration    Comprehension Verbalized understanding;Returned demonstration            PT Short Term Goals - 12/30/20 0947      PT SHORT TERM GOAL #1   Title PT to be wearing thigh high compression garments    Time 10    Period Days    Status New    Target Date 01/09/21      PT SHORT TERM GOAL #2   Title PT to be completing self manual techniques to decrese edema in LE    Time 10    Period Days    Status New      PT SHORT TERM GOAL #3   Title PT to be using compression pump    Time 10    Period Days    Status New                     Plan - 12/30/20 6063    Clinical Impression Statement Katherine Heath is a 70 yo female who has been referred to this clinic for edema in her LE.  The pt does not have lymphedema but most likely the swelling in her LE is due to her chronic vericose veins.  She was educated in exercises, compression garments and compression pumps which will all be beneficial in assisting in decreasing the edmea so that the pt does not have any complications such as wounds or development of lymphedema.    Personal Factors and Comorbidities Time since onset of injury/illness/exacerbation;Comorbidity 1    Comorbidities chronic venous insufficiency    Examination-Participation Restrictions Other    Stability/Clinical Decision Making Stable/Uncomplicated    Clinical Decision Making Low    Rehab Potential Good    PT Frequency 1x / week    PT Duration 2 weeks    PT Treatment/Interventions ADLs/Self Care Home Management;Patient/family education;Manual techniques;Therapeutic exercise    PT Next Visit Plan answer any questions about self massage.  Therapist had pt complete LE massaging only secondary to not having lymphedema, answer any questions about donning garment or using  compression pump           Patient will benefit from skilled therapeutic intervention in order to  improve the following deficits and impairments:  Increased edema,Decreased skin integrity  Visit Diagnosis: Localized edema     Problem List Patient Active Problem List   Diagnosis Date Noted  . Lymphedema 12/02/2020  . Aortic atherosclerosis ([place]) 11/27/2020  . Change in bowel habit 08/01/2020  . Colon cancer screening 08/01/2020  . Diarrhea 08/01/2020  . Female climacteric state 08/01/2020  . Gastroesophageal reflux disease 08/01/2020  . 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 Localized swelling, mass and lump, right lower limb 08/01/2020  . Rectal bleeding 08/01/2020  . Hyperlipidemia 06/17/2017  . Exhaustion 05/26/2016  . Shortness of breath 11/23/2015  . Other fatigue 11/23/2015    Rayetta Humphrey, PT CLT [phone] 12/30/2020, 9:49 AM  [facility] [address] [place], [place], [address] Phone: [phone]   Fax:  [phone]  Name: Darrian Goodwill MRN: [id number] Date of Birth: Feb 26, 1951

## 2021-01-01 ENCOUNTER — Ambulatory Visit (HOSPITAL_COMMUNITY): Payer: Medicare Other

## 2021-01-06 ENCOUNTER — Ambulatory Visit (HOSPITAL_COMMUNITY): Payer: Medicare Other | Admitting: Physical Therapy

## 2021-01-08 ENCOUNTER — Ambulatory Visit (HOSPITAL_COMMUNITY): Payer: Medicare Other | Admitting: Physical Therapy

## 2021-01-08 ENCOUNTER — Encounter (HOSPITAL_COMMUNITY): Payer: Self-pay | Admitting: Physical Therapy

## 2021-01-08 NOTE — Therapy (Signed)
[place], [place], [address] Phone: [phone]   Fax:  [phone]  Patient Details  Name: Katherine Heath MRN: [id number] Date of Birth: 11-23-1950 Referring Provider:  No ref. provider found  Encounter Date: 01/08/2021  PHYSICAL THERAPY DISCHARGE SUMMARY  Visits from Start of Care: 1  Current functional level related to goals / functional outcomes: PT called stating that she was doing great and did not need to return.    Remaining deficits: Chronic venous statsis   Education / Equipment: HEP, use of compression garment and pump Plan: Patient agrees to discharge.  Patient goals were met. Patient is being discharged due to being pleased with the current functional level.   Rayetta Humphrey, PT CLT [phone]?????     01/08/2021, 11:17 AM  [place], [place], [address] Phone: [phone]   Fax:  [phone]

## 2021-01-10 ENCOUNTER — Encounter (HOSPITAL_COMMUNITY): Payer: Medicare Other | Admitting: Physical Therapy

## 2021-01-10 ENCOUNTER — Ambulatory Visit (HOSPITAL_COMMUNITY): Payer: Medicare Other | Admitting: Physical Therapy

## 2021-01-14 ENCOUNTER — Encounter (HOSPITAL_COMMUNITY): Payer: Medicare Other | Admitting: Physical Therapy

## 2021-01-16 ENCOUNTER — Encounter (HOSPITAL_COMMUNITY): Payer: Medicare Other

## 2021-01-17 ENCOUNTER — Encounter (HOSPITAL_COMMUNITY): Payer: Medicare Other | Admitting: Physical Therapy

## 2021-01-20 ENCOUNTER — Encounter (HOSPITAL_COMMUNITY): Payer: Medicare Other | Admitting: Physical Therapy

## 2021-01-22 ENCOUNTER — Encounter (HOSPITAL_COMMUNITY): Payer: Medicare Other

## 2021-01-24 ENCOUNTER — Encounter (HOSPITAL_COMMUNITY): Payer: Medicare Other

## 2021-01-28 ENCOUNTER — Encounter (HOSPITAL_COMMUNITY): Payer: Medicare Other

## 2021-01-30 ENCOUNTER — Encounter (HOSPITAL_COMMUNITY): Payer: Medicare Other | Admitting: Physical Therapy

## 2021-01-31 ENCOUNTER — Encounter (HOSPITAL_COMMUNITY): Payer: Medicare Other

## 2021-02-03 ENCOUNTER — Encounter (HOSPITAL_COMMUNITY): Payer: Medicare Other | Admitting: Physical Therapy

## 2021-02-05 ENCOUNTER — Encounter (HOSPITAL_COMMUNITY): Payer: Medicare Other | Admitting: Physical Therapy

## 2021-02-07 ENCOUNTER — Encounter (HOSPITAL_COMMUNITY): Payer: Medicare Other | Admitting: Physical Therapy

## 2021-02-10 ENCOUNTER — Encounter (HOSPITAL_COMMUNITY): Payer: Medicare Other | Admitting: Physical Therapy

## 2021-02-12 ENCOUNTER — Encounter (HOSPITAL_COMMUNITY): Payer: Medicare Other | Admitting: Physical Therapy

## 2021-02-14 ENCOUNTER — Encounter (HOSPITAL_COMMUNITY): Payer: Medicare Other

## 2021-03-06 ENCOUNTER — Other Ambulatory Visit: Payer: Self-pay | Admitting: Internal Medicine

## 2021-03-06 DIAGNOSIS — Z1231 Encounter for screening mammogram for malignant neoplasm of breast: Secondary | ICD-10-CM

## 2021-04-29 ENCOUNTER — Ambulatory Visit
Admission: RE | Admit: 2021-04-29 | Discharge: 2021-04-29 | Disposition: A | Discharge status: Home / Self Care | Payer: Medicare Other | Source: Ambulatory Visit | Attending: Internal Medicine | Admitting: Internal Medicine

## 2021-04-29 ENCOUNTER — Other Ambulatory Visit: Payer: Self-pay

## 2021-04-29 DIAGNOSIS — Z1231 Encounter for screening mammogram for malignant neoplasm of breast: Secondary | ICD-10-CM

## 2021-04-30 DIAGNOSIS — Z23 Encounter for immunization: Secondary | ICD-10-CM | POA: Diagnosis not present

## 2021-05-05 ENCOUNTER — Other Ambulatory Visit: Payer: Self-pay | Admitting: Internal Medicine

## 2021-05-05 DIAGNOSIS — R928 Other abnormal and inconclusive findings on diagnostic imaging of breast: Secondary | ICD-10-CM

## 2021-05-21 ENCOUNTER — Other Ambulatory Visit: Payer: Medicare Other

## 2021-05-22 ENCOUNTER — Ambulatory Visit
Admission: RE | Admit: 2021-05-22 | Discharge: 2021-05-22 | Disposition: A | Discharge status: Home / Self Care | Payer: Medicare Other | Source: Ambulatory Visit | Attending: Internal Medicine | Admitting: Internal Medicine

## 2021-05-22 ENCOUNTER — Ambulatory Visit: Payer: Medicare Other

## 2021-05-22 ENCOUNTER — Other Ambulatory Visit: Payer: Self-pay

## 2021-05-22 DIAGNOSIS — R928 Other abnormal and inconclusive findings on diagnostic imaging of breast: Secondary | ICD-10-CM

## 2021-05-22 DIAGNOSIS — R922 Inconclusive mammogram: Secondary | ICD-10-CM | POA: Diagnosis not present

## 2021-05-31 DIAGNOSIS — Z23 Encounter for immunization: Secondary | ICD-10-CM | POA: Diagnosis not present

## 2021-06-04 ENCOUNTER — Other Ambulatory Visit: Payer: Self-pay

## 2021-06-04 ENCOUNTER — Ambulatory Visit (INDEPENDENT_AMBULATORY_CARE_PROVIDER_SITE_OTHER): Payer: Medicare Other

## 2021-06-04 ENCOUNTER — Ambulatory Visit: Payer: No Typology Code available for payment source | Admitting: Internal Medicine

## 2021-06-04 VITALS — BP 118/60 | HR 70 | Temp 98.2°F | Ht 62.6 in | Wt 146.8 lb

## 2021-06-04 DIAGNOSIS — Z0000 Encounter for general adult medical examination without abnormal findings: Secondary | ICD-10-CM

## 2021-06-04 NOTE — Progress Notes (Signed)
This visit occurred during the SARS-CoV-2 public health emergency.  Safety protocols were in place, including screening questions prior to the visit, additional usage of staff PPE, and extensive cleaning of exam room while observing appropriate contact time as indicated for disinfecting solutions.  Subjective:   Jaree Dwight is a 70 y.o. female who presents for Medicare Annual (Subsequent) preventive examination.  Review of Systems     Cardiac Risk Factors include: advanced age (>52men, >30 women)     Objective:    Today's Vitals   06/04/21 1033  BP: 118/60  Pulse: 70  Temp: 98.2 F (36.8 C)  TempSrc: Oral  SpO2: 95%  Weight: 146 lb 12.8 oz (66.6 kg)  Height: 5' 2.6" (1.59 m)   Body mass index is 26.34 kg/m.  Advanced Directives 06/04/2021 06/23/2020 05/29/2020 05/16/2019  Does Patient Have a Medical Advance Directive? Yes Yes Yes Yes  Type of Paramedic of [place];Living will [place];Living will [place];Living will [place];Living will  Copy of [place] in Chart? No - copy requested - No - copy requested No - copy requested    Current Medications (verified) Outpatient Encounter Medications as of 06/04/2021  Medication Sig   BLACK COHOSH EXTRACT PO Take 1 tablet by mouth. 2 per day   Cholecalciferol (VITAMIN D-3 PO) Take by mouth daily. 6000 iu daily   ELDERBERRY PO Take 3 tablets by mouth.   NON FORMULARY DIM   Red Yeast Rice Extract (RED YEAST RICE PO) Take 1 capsule by mouth 2 (two) times daily.   VITAMIN K PO Take by mouth.   magnesium oxide (MAG-OX) 400 MG tablet Take 400 mg by mouth daily. (Patient not taking: Reported on 06/04/2021)   No facility-administered encounter medications on file as of 06/04/2021.    Allergies (verified) Latex   History: Past Medical History:  Diagnosis Date   Arthritis    Depression    GERD (gastroesophageal reflux disease)     Hyperlipidemia 06/17/2017   Shortness of breath 11/23/2015   Past Surgical History:  Procedure Laterality Date   sphincterotomy  08/17/2001   WRIST SURGERY Left 06/2020   Family History  Problem Relation Age of Onset   Heart disease Father    Heart attack Father    Heart disease Maternal Grandmother    Hypertension Maternal Grandmother    Heart disease Paternal Grandfather    Stroke Mother    Stroke Maternal Grandfather    Social History   Socioeconomic History   Marital status: Married    Spouse name: Not on file   Number of children: Not on file   Years of education: Not on file   Highest education level: Not on file  Occupational History   Occupation: retired  Tobacco Use   Smoking status: Never   Smokeless tobacco: Never  Vaping Use   Vaping Use: Never used  Substance and Sexual Activity   Alcohol use: Not Currently    Alcohol/week: 0.0 standard drinks   Drug use: No   Sexual activity: Not Currently  Other Topics Concern   Not on file  Social History Narrative   Not on file   Social Determinants of Health   Financial Resource Strain: Low Risk    Difficulty of Paying Living Expenses: Not hard at all  Food Insecurity: No Food Insecurity   Worried About Charity fundraiser in the Last Year: Never true   Arboriculturist in  the Last Year: Never true  Transportation Needs: No Transportation Needs   Lack of Transportation (Medical): No   Lack of Transportation (Non-Medical): No  Physical Activity: Sufficiently Active   Days of Exercise per Week: 7 days   Minutes of Exercise per Session: 60 min  Stress: No Stress Concern Present   Feeling of Stress : Not at all  Social Connections: Not on file    Tobacco Counseling Counseling given: Not Answered   Clinical Intake:  Pre-visit preparation completed: Yes  Pain : No/denies pain     Nutritional Status: BMI 25 -29 Overweight Nutritional Risks: None Diabetes: No  How often do you need to have someone  help you when you read instructions, pamphlets, or other written materials from your doctor or pharmacy?: 1 - Never What is the last grade level you completed in school?: graduate school  Diabetic? no  Interpreter Needed?: No  Information entered by :: NAllen LPN   Activities of Daily Living In your present state of health, do you have any difficulty performing the following activities: 06/04/2021  Hearing? Y  Vision? N  Difficulty concentrating or making decisions? Y  Walking or climbing stairs? Y  Dressing or bathing? N  Doing errands, shopping? N  Preparing Food and eating ? N  Using the Toilet? N  In the past six months, have you accidently leaked urine? Y  Do you have problems with loss of bowel control? N  Managing your Medications? N  Managing your Finances? N  Housekeeping or managing your Housekeeping? N  Some recent data might be hidden    Patient Care Team: [name], MD as PCP - General (Internal Medicine)  Indicate any recent Medical Services you may have received from other than [facility] providers in the past year (date may be approximate).     Assessment:   This is a routine wellness examination for [place].  Hearing/Vision screen Vision Screening - Comments:: Regular eye exams, [facility]  Dietary issues and exercise activities discussed: Current Exercise Habits: Home exercise routine, Type of exercise: walking;yoga;strength training/weights;calisthenics, Time (Minutes): 60, Frequency (Times/Week): 7, Weekly Exercise (Minutes/Week): 420   Goals Addressed             This Visit's Progress    Patient Stated       06/04/2021, wants to lower cholesterol, maintain flexibilty, strength, and balance       Depression Screen PHQ 2/9 Scores 06/04/2021 11/27/2020 05/29/2020 05/16/2019 01/31/2019 09/29/2018 05/26/2018  PHQ - 2 Score 0 0 1 1 0 0 0  PHQ- 9 Score - 7 11 8  - - -    Fall Risk Fall Risk  06/04/2021 05/29/2020 05/16/2019 01/31/2019  09/29/2018  Falls in the past year? 1 1 1 1 1   Comment tripping during walks slipped on mud or stumbled over a branch 4 falls due to tripping and slipping; walking on trails - -  Number falls in past yr: 1 1 1 1 1   Injury with Fall? 1 0 0 0 1  Comment broke wrist - - - -  Risk for fall due to : History of fall(s);Impaired balance/gait History of fall(s) History of fall(s) - -  Follow up Falls evaluation completed;Education provided;Falls prevention discussed Falls evaluation completed;Education provided;Falls prevention discussed Falls evaluation completed;Education provided;Falls prevention discussed - -    FALL RISK PREVENTION PERTAINING TO THE HOME:  Any stairs in or around the home? Yes  If so, are there any without handrails? No  Home free of loose  throw rugs in walkways, pet beds, electrical cords, etc? Yes  Adequate lighting in your home to reduce risk of falls? Yes   ASSISTIVE DEVICES UTILIZED TO PREVENT FALLS:  Life alert? No  Use of a cane, walker or w/c? No  Grab bars in the bathroom? Yes  Shower chair or bench in shower? Yes  Elevated toilet seat or a handicapped toilet? No   TIMED UP AND GO:  Was the test performed? No .     Gait steady and fast without use of assistive device  Cognitive Function:     6CIT Screen 06/04/2021 05/29/2020 05/16/2019  What Year? 0 points 0 points 0 points  What month? 0 points 0 points 0 points  What time? 0 points 0 points 0 points  Count back from 20 0 points 0 points 0 points  Months in reverse 0 points 0 points 0 points  Repeat phrase 0 points 0 points 0 points  Total Score 0 0 0    Immunizations Immunization History  Administered Date(s) Administered   Influenza, High Dose Seasonal PF 05/26/2018, 05/31/2021   PFIZER(Purple Top)SARS-COV-2 Vaccination 09/22/2019, 10/13/2019, 06/07/2020, 11/16/2020, 04/30/2021   Pneumococcal Conjugate-13 06/06/2018   Tdap 12/14/2018   Zoster Recombinat (Shingrix) 03/07/2020, 05/16/2020     TDAP status: Up to date  Flu Vaccine status: Up to date  Pneumococcal vaccine status: Declined,  Education has been provided regarding the importance of this vaccine but patient still declined. Advised may receive this vaccine at local pharmacy or Health Dept. Aware to provide a copy of the vaccination record if obtained from local pharmacy or Health Dept. Verbalized acceptance and understanding.   Covid-19 vaccine status: Completed vaccines  Qualifies for Shingles Vaccine? Yes   Zostavax completed No   Shingrix Completed?: Yes  Screening Tests Health Maintenance  Topic Date Due   Pneumonia Vaccine 52+ Years old (2 - PPSV23 or PCV20) 06/07/2019   MAMMOGRAM  04/30/2023   COLONOSCOPY (Pts 45-52yrs Insurance coverage will need to be confirmed)  01/02/2026   TETANUS/TDAP  12/13/2028   INFLUENZA VACCINE  Completed   DEXA SCAN  Completed   COVID-19 Vaccine  Completed   Hepatitis C Screening  Completed   Zoster Vaccines- Shingrix  Completed   HPV VACCINES  Aged Out    Health Maintenance  Health Maintenance Due  Topic Date Due   Pneumonia Vaccine 26+ Years old (2 - PPSV23 or PCV20) 06/07/2019    Colorectal cancer screening: Type of screening: Colonoscopy. Completed 01/03/2016. Repeat every 10 years  Mammogram status: Completed 04/29/2021. Repeat every year  Bone Density status: Completed 04/05/2019.  Lung Cancer Screening: (Low Dose CT Chest recommended if Age 41-80 years, 30 pack-year currently smoking OR have quit w/in 15years.) does not qualify.   Lung Cancer Screening Referral: no  Additional Screening:  Hepatitis C Screening: does qualify; Completed 03/01/2018  Vision Screening: Recommended annual ophthalmology exams for early detection of glaucoma and other disorders of the eye. Is the patient up to date with their annual eye exam?  Yes  Who is the provider or what is the name of the office in which the patient attends annual eye exams? [facility] If pt is  not established with a provider, would they like to be referred to a provider to establish care? No .   Dental Screening: Recommended annual dental exams for proper oral hygiene  Community Resource Referral / Chronic Care Management: CRR required this visit?  No   CCM required this visit?  No  Plan:     I have personally reviewed and noted the following in the patient's chart:   Medical and social history Use of alcohol, tobacco or illicit drugs  Current medications and supplements including opioid prescriptions.  Functional ability and status Nutritional status Physical activity Advanced directives List of other physicians Hospitalizations, surgeries, and ER visits in previous 12 months Vitals Screenings to include cognitive, depression, and falls Referrals and appointments  In addition, I have reviewed and discussed with patient certain preventive protocols, quality metrics, and best practice recommendations. A written personalized care plan for preventive services as well as general preventive health recommendations were provided to patient.     Kellie Simmering, LPN   [id number]   Nurse Notes:

## 2021-06-04 NOTE — Patient Instructions (Signed)
[name] , Thank you for taking time to come for your Medicare Wellness Visit. I appreciate your ongoing commitment to your health goals. Please review the following plan we discussed and let me know if I can assist you in the future.   Screening recommendations/referrals: Colonoscopy: completed 01/03/2016 Mammogram: completed 04/29/2021 Bone Density: completed 04/05/2019 Recommended yearly ophthalmology/optometry visit for glaucoma screening and checkup Recommended yearly dental visit for hygiene and checkup  Vaccinations: Influenza vaccine: completed 05/30/2021 Pneumococcal vaccine: due Tdap vaccine: completed 12/14/2018, due 12/13/2028 Shingles vaccine: completed   Covid-19: 04/30/2021, 11/16/2020, 06/07/2020, 10/13/2019, 09/22/2019  Advanced directives: Please bring a copy of your POA (Power of Attorney) and/or Living Will to your next appointment.   Conditions/risks identified: none  Next appointment: Follow up in one year for your annual wellness visit    Preventive Care 65 Years and Older, Female Preventive care refers to lifestyle choices and visits with your health care provider that can promote health and wellness. What does preventive care include? A yearly physical exam. This is also called an annual well check. Dental exams once or twice a year. Routine eye exams. Ask your health care provider how often you should have your eyes checked. Personal lifestyle choices, including: Daily care of your teeth and gums. Regular physical activity. Eating a healthy diet. Avoiding tobacco and drug use. Limiting alcohol use. Practicing safe sex. Taking low-dose aspirin every day. Taking vitamin and mineral supplements as recommended by your health care provider. What happens during an annual well check? The services and screenings done by your health care provider during your annual well check will depend on your age, overall health, lifestyle risk factors, and family history of  disease. Counseling  Your health care provider may ask you questions about your: Alcohol use. Tobacco use. Drug use. Emotional well-being. Home and relationship well-being. Sexual activity. Eating habits. History of falls. Memory and ability to understand (cognition). Work and work Statistician. Reproductive health. Screening  You may have the following tests or measurements: Height, weight, and BMI. Blood pressure. Lipid and cholesterol levels. These may be checked every 5 years, or more frequently if you are over 35 years old. Skin check. Lung cancer screening. You may have this screening every year starting at age 39 if you have a 30-pack-year history of smoking and currently smoke or have quit within the past 15 years. Fecal occult blood test (FOBT) of the stool. You may have this test every year starting at age 21. Flexible sigmoidoscopy or colonoscopy. You may have a sigmoidoscopy every 5 years or a colonoscopy every 10 years starting at age 38. Hepatitis C blood test. Hepatitis B blood test. Sexually transmitted disease (STD) testing. Diabetes screening. This is done by checking your blood sugar (glucose) after you have not eaten for a while (fasting). You may have this done every 1-3 years. Bone density scan. This is done to screen for osteoporosis. You may have this done starting at age 1. Mammogram. This may be done every 1-2 years. Talk to your health care provider about how often you should have regular mammograms. Talk with your health care provider about your test results, treatment options, and if necessary, the need for more tests. Vaccines  Your health care provider may recommend certain vaccines, such as: Influenza vaccine. This is recommended every year. Tetanus, diphtheria, and acellular pertussis (Tdap, Td) vaccine. You may need a Td booster every 10 years. Zoster vaccine. You may need this after age 52. Pneumococcal 13-valent conjugate (PCV13) vaccine. One  dose is recommended after age 35. Pneumococcal polysaccharide (PPSV23) vaccine. One dose is recommended after age 4. Talk to your health care provider about which screenings and vaccines you need and how often you need them. This information is not intended to replace advice given to you by your health care provider. Make sure you discuss any questions you have with your health care provider. Document Released: 08/30/2015 Document Revised: 04/22/2016 Document Reviewed: 06/04/2015 Elsevier Interactive Patient Education  2017 [place] Prevention in the Home Falls can cause injuries. They can happen to people of all ages. There are many things you can do to make your home safe and to help prevent falls. What can I do on the outside of my home? Regularly fix the edges of walkways and driveways and fix any cracks. Remove anything that might make you trip as you walk through a door, such as a raised step or threshold. Trim any bushes or trees on the path to your home. Use bright outdoor lighting. Clear any walking paths of anything that might make someone trip, such as rocks or tools. Regularly check to see if handrails are loose or broken. Make sure that both sides of any steps have handrails. Any raised decks and porches should have guardrails on the edges. Have any leaves, snow, or ice cleared regularly. Use sand or salt on walking paths during winter. Clean up any spills in your garage right away. This includes oil or grease spills. What can I do in the bathroom? Use night lights. Install grab bars by the toilet and in the tub and shower. Do not use towel bars as grab bars. Use non-skid mats or decals in the tub or shower. If you need to sit down in the shower, use a plastic, non-slip stool. Keep the floor dry. Clean up any water that spills on the floor as soon as it happens. Remove soap buildup in the tub or shower regularly. Attach bath mats securely with double-sided  non-slip rug tape. Do not have throw rugs and other things on the floor that can make you trip. What can I do in the bedroom? Use night lights. Make sure that you have a light by your bed that is easy to reach. Do not use any sheets or blankets that are too big for your bed. They should not hang down onto the floor. Have a firm chair that has side arms. You can use this for support while you get dressed. Do not have throw rugs and other things on the floor that can make you trip. What can I do in the kitchen? Clean up any spills right away. Avoid walking on wet floors. Keep items that you use a lot in easy-to-reach places. If you need to reach something above you, use a strong step stool that has a grab bar. Keep electrical cords out of the way. Do not use floor polish or wax that makes floors slippery. If you must use wax, use non-skid floor wax. Do not have throw rugs and other things on the floor that can make you trip. What can I do with my stairs? Do not leave any items on the stairs. Make sure that there are handrails on both sides of the stairs and use them. Fix handrails that are broken or loose. Make sure that handrails are as long as the stairways. Check any carpeting to make sure that it is firmly attached to the stairs. Fix any carpet that is loose or worn. Avoid  having throw rugs at the top or bottom of the stairs. If you do have throw rugs, attach them to the floor with carpet tape. Make sure that you have a light switch at the top of the stairs and the bottom of the stairs. If you do not have them, ask someone to add them for you. What else can I do to help prevent falls? Wear shoes that: Do not have high heels. Have rubber bottoms. Are comfortable and fit you well. Are closed at the toe. Do not wear sandals. If you use a stepladder: Make sure that it is fully opened. Do not climb a closed stepladder. Make sure that both sides of the stepladder are locked into place. Ask  someone to hold it for you, if possible. Clearly mark and make sure that you can see: Any grab bars or handrails. First and last steps. Where the edge of each step is. Use tools that help you move around (mobility aids) if they are needed. These include: Canes. Walkers. Scooters. Crutches. Turn on the lights when you go into a dark area. Replace any light bulbs as soon as they burn out. Set up your furniture so you have a clear path. Avoid moving your furniture around. If any of your floors are uneven, fix them. If there are any pets around you, be aware of where they are. Review your medicines with your doctor. Some medicines can make you feel dizzy. This can increase your chance of falling. Ask your doctor what other things that you can do to help prevent falls. This information is not intended to replace advice given to you by your health care provider. Make sure you discuss any questions you have with your health care provider. Document Released: 05/30/2009 Document Revised: 01/09/2016 Document Reviewed: 09/07/2014 Elsevier Interactive Patient Education  2017 [employer].

## 2021-11-25 IMAGING — MG MM DIGITAL SCREENING BILAT W/ TOMO AND CAD
8 series · 8 of 24 positions shown · non-contrast
Comparison: Previous exam(s).

CLINICAL DATA: Screening.

EXAM:
DIGITAL SCREENING BILATERAL MAMMOGRAM WITH TOMOSYNTHESIS AND CAD
TECHNIQUE: Bilateral screening digital craniocaudal and mediolateral oblique
mammograms were obtained. Bilateral screening digital breast
tomosynthesis was performed. The images were evaluated with
computer-aided detection.

[L CC synth-2D]
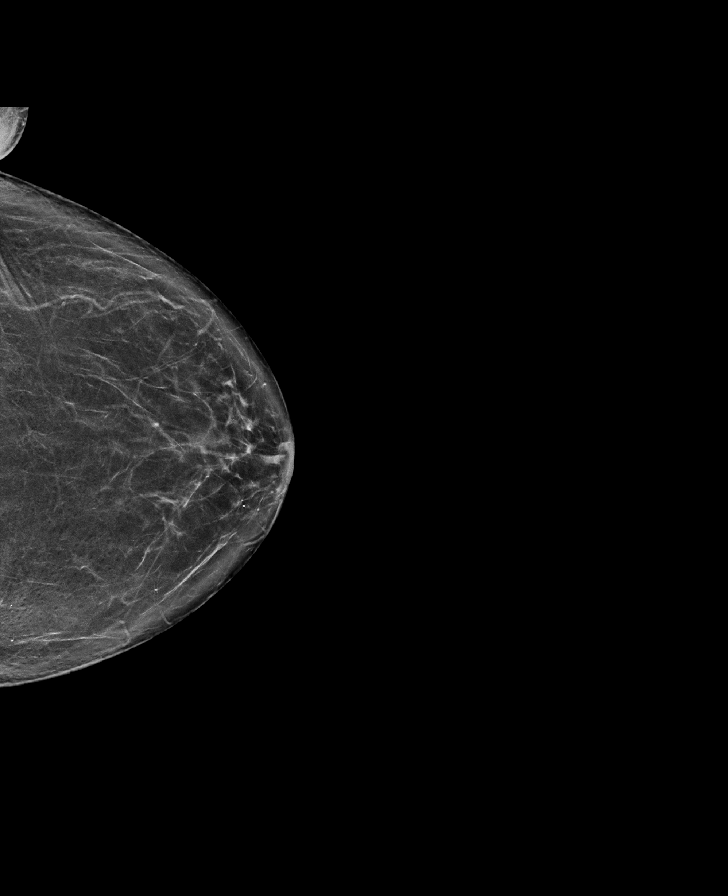

[R MLO synth-2D]
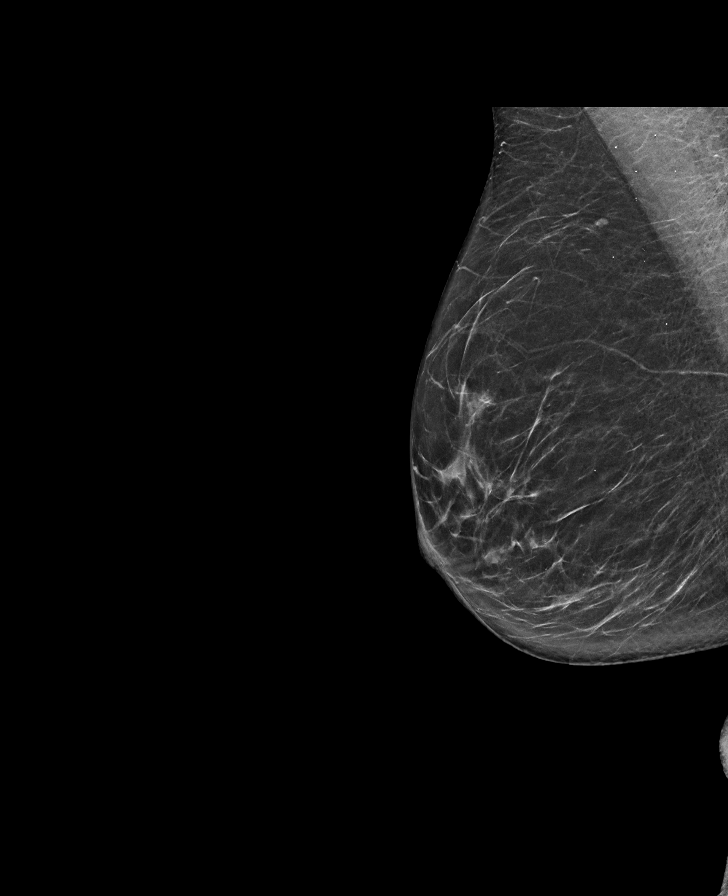

[R CC synth-2D]
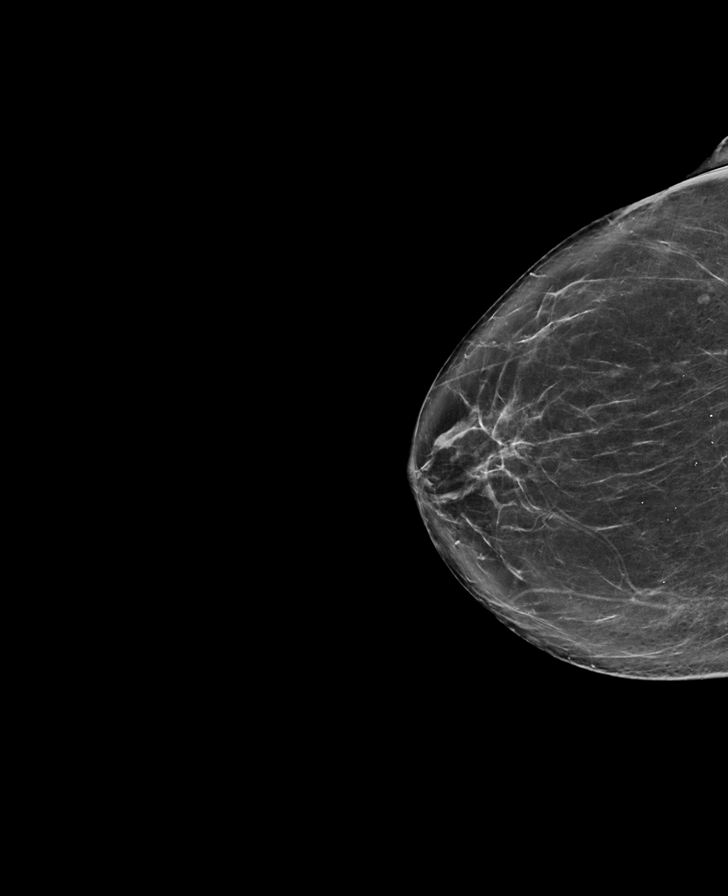

[L MLO synth-2D]
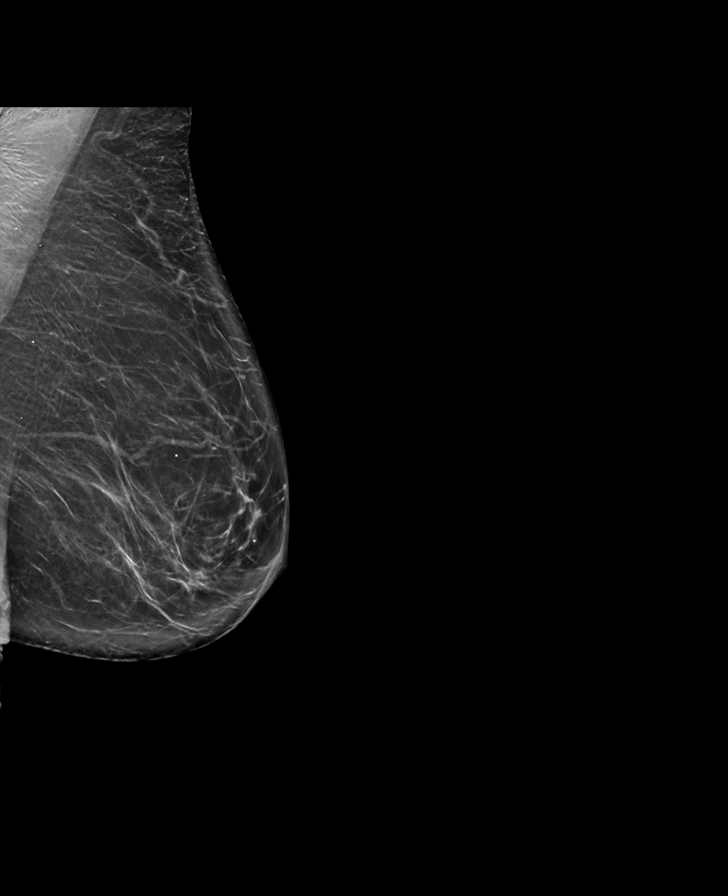

[R CC tomo · tomo slice 39/78.0]
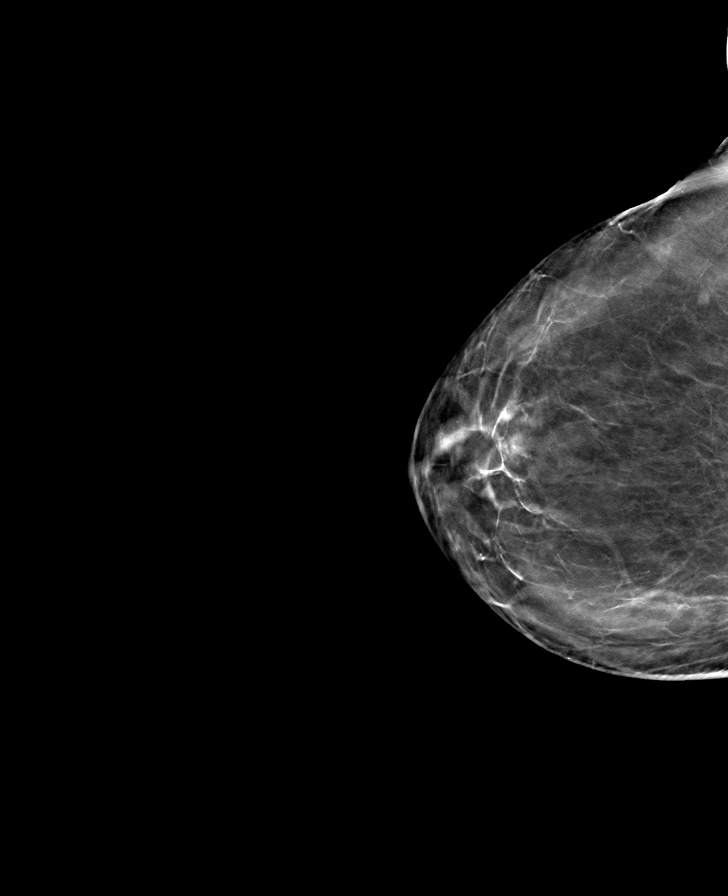

[L CC tomo · tomo slice 20/39.0]
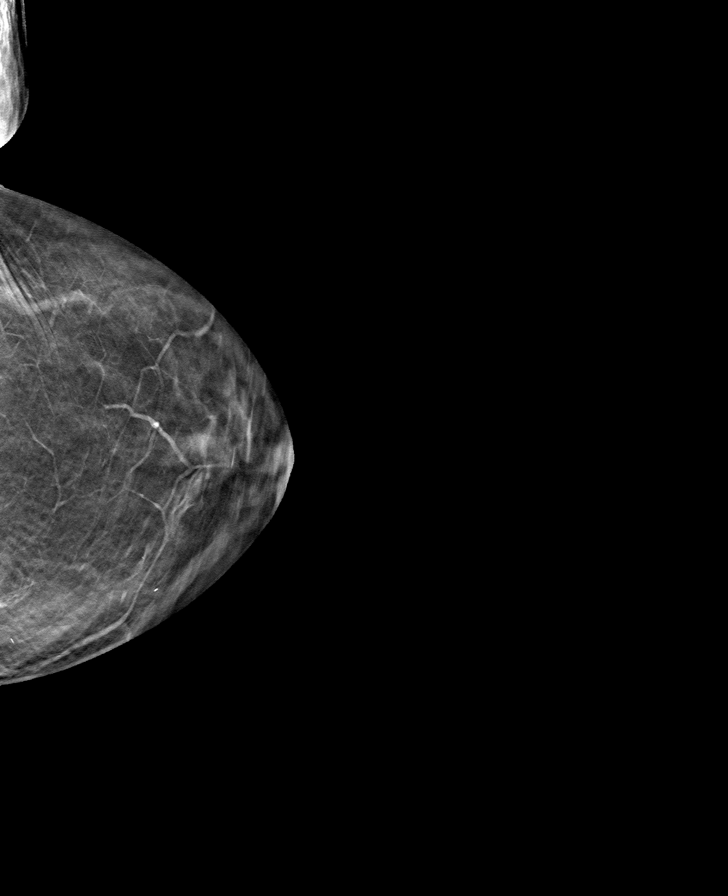

[L MLO tomo · tomo slice 41/80.0]
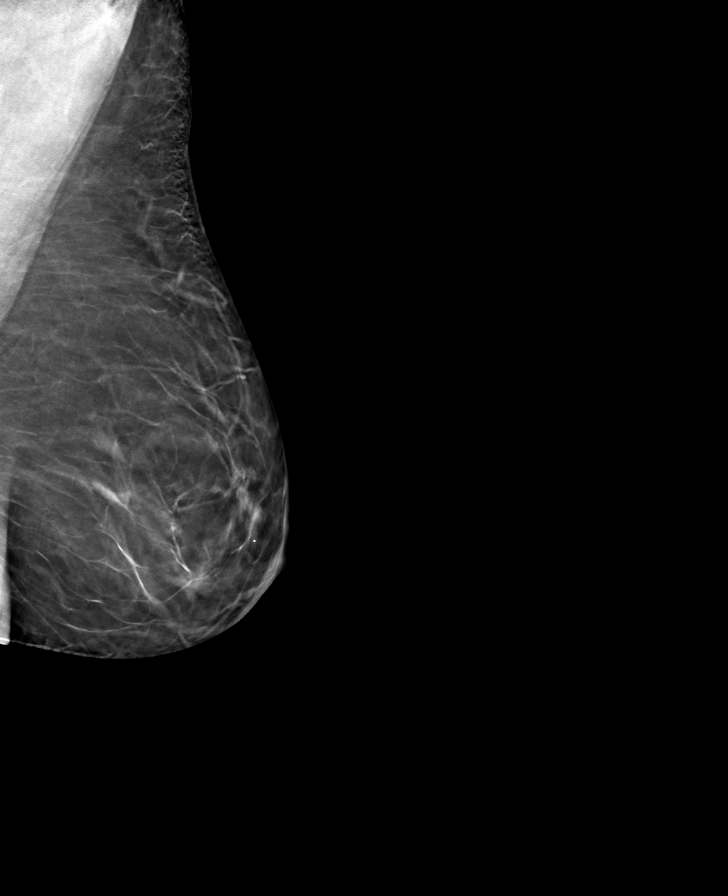

[R MLO tomo · tomo slice 35/69.0]
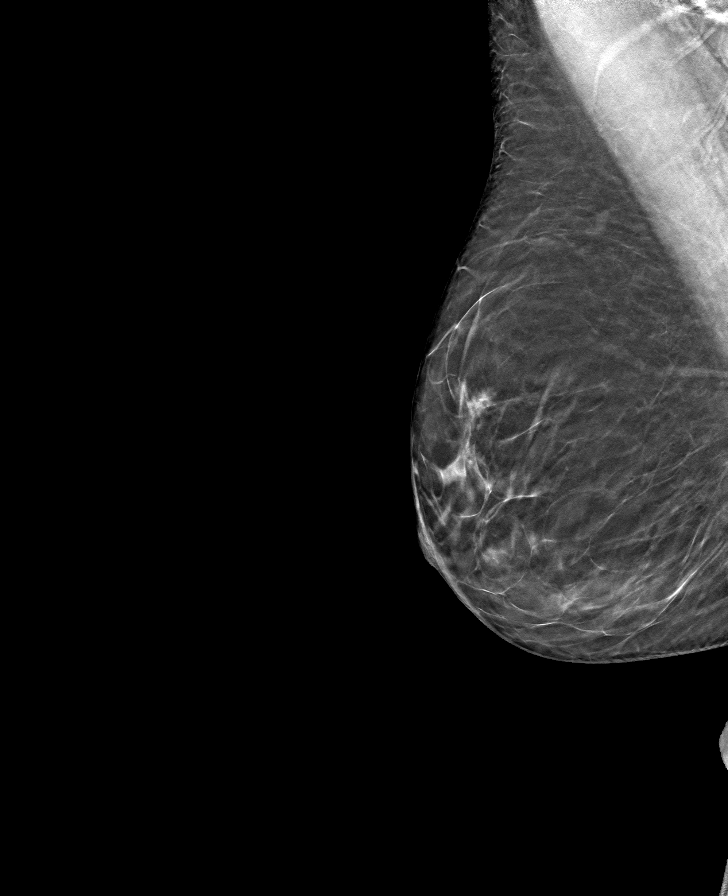

[8 of 24 positions shown; findings below may reference images not displayed]

ACR Breast Density Category c: The breast tissue is heterogeneously
dense, which may obscure small masses.
FINDINGS: In the right breast, a possible asymmetry warrants further
evaluation. In the left breast, no findings suspicious for
malignancy.
IMPRESSION: Further evaluation is suggested for possible asymmetry in the right
breast.

RECOMMENDATION:
Diagnostic mammogram and possibly ultrasound of the right breast.
(Code:[id number]-99A)

The patient will be contacted regarding the findings, and additional
imaging will be scheduled.

BI-RADS CATEGORY  0: Incomplete. Need additional imaging evaluation
and/or prior mammograms for comparison.

## 2021-12-18 IMAGING — MG MM DIGITAL DIAGNOSTIC UNILAT*R* W/ TOMO W/ CAD
4 series · 4 of 12 positions shown · non-contrast
Comparison: Previous exam(s).

CLINICAL DATA: 70-year-old female presenting as a recall from
screening for possible right breast asymmetry.

EXAM:
DIGITAL DIAGNOSTIC UNILATERAL RIGHT MAMMOGRAM WITH TOMOSYNTHESIS AND
CAD
TECHNIQUE: Right digital diagnostic mammography and breast tomosynthesis was
performed. The images were evaluated with computer-aided detection.

[R ML synth-2D]
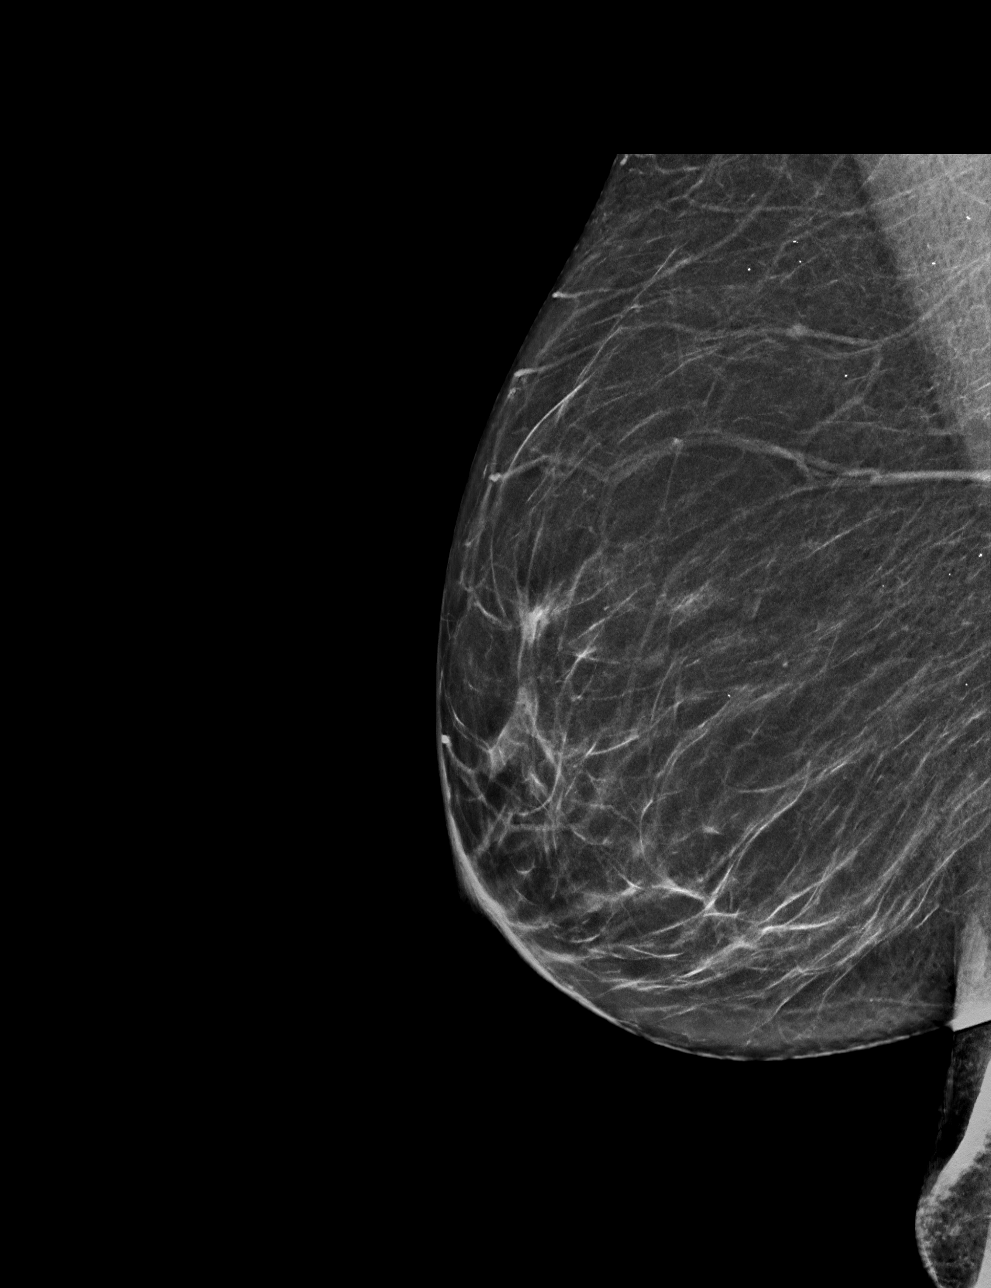

[R MLO synth-2D]
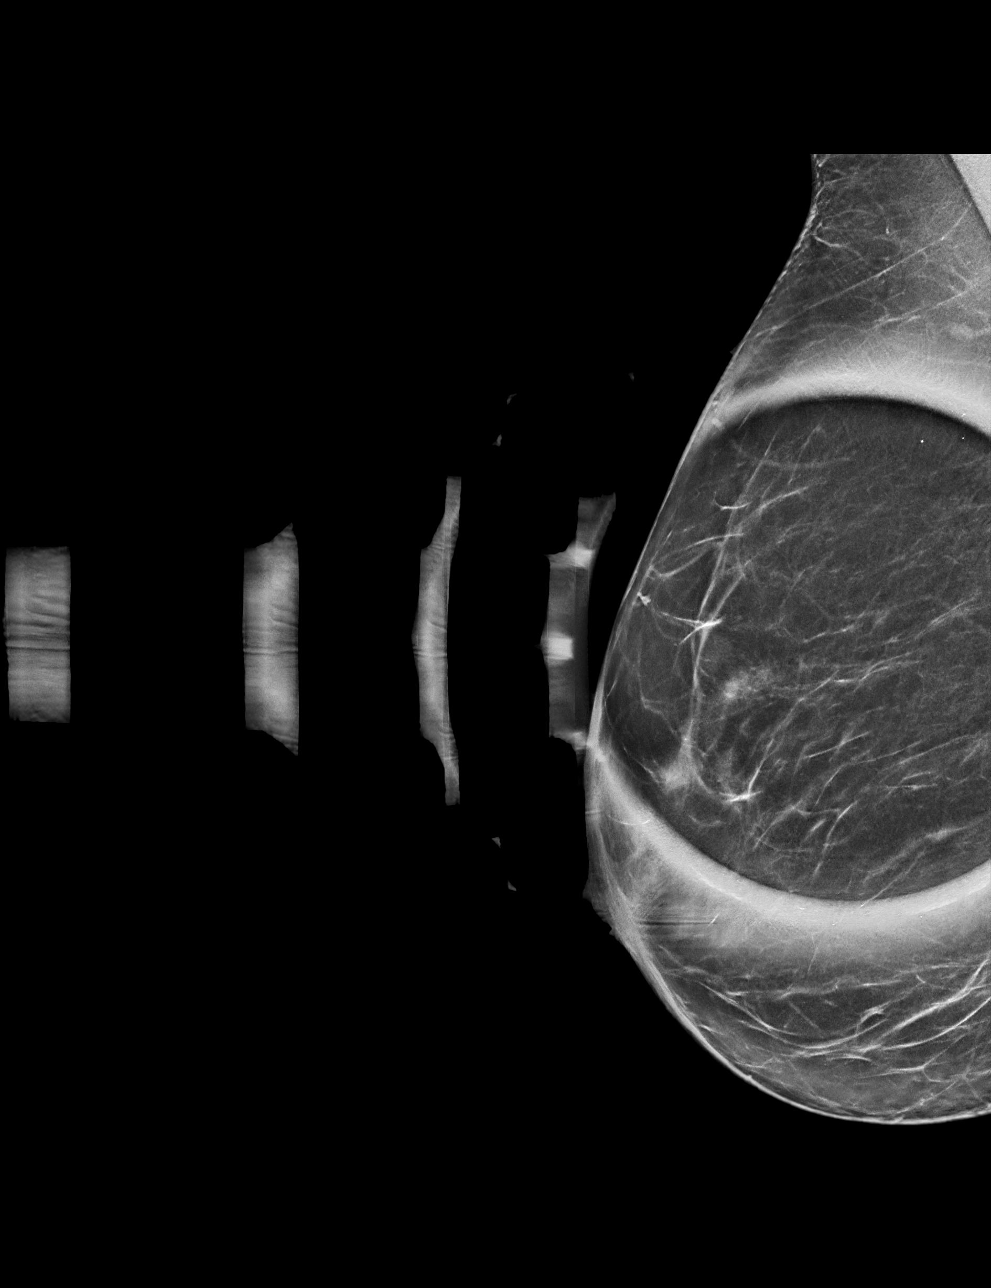

[R ML tomo · tomo slice 31/60.0]
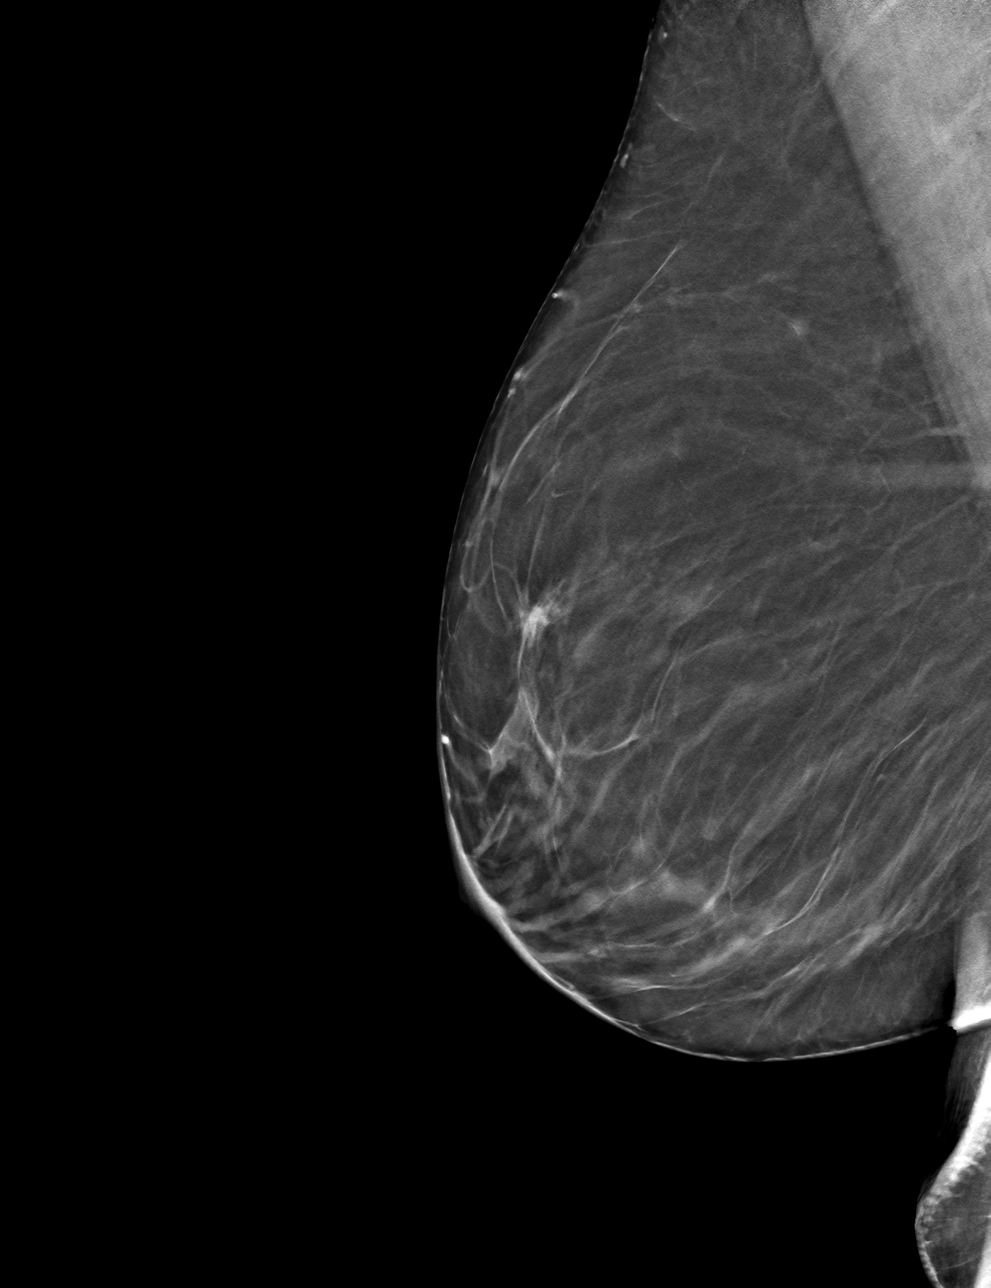

[R MLO tomo · tomo slice 30/59.0]
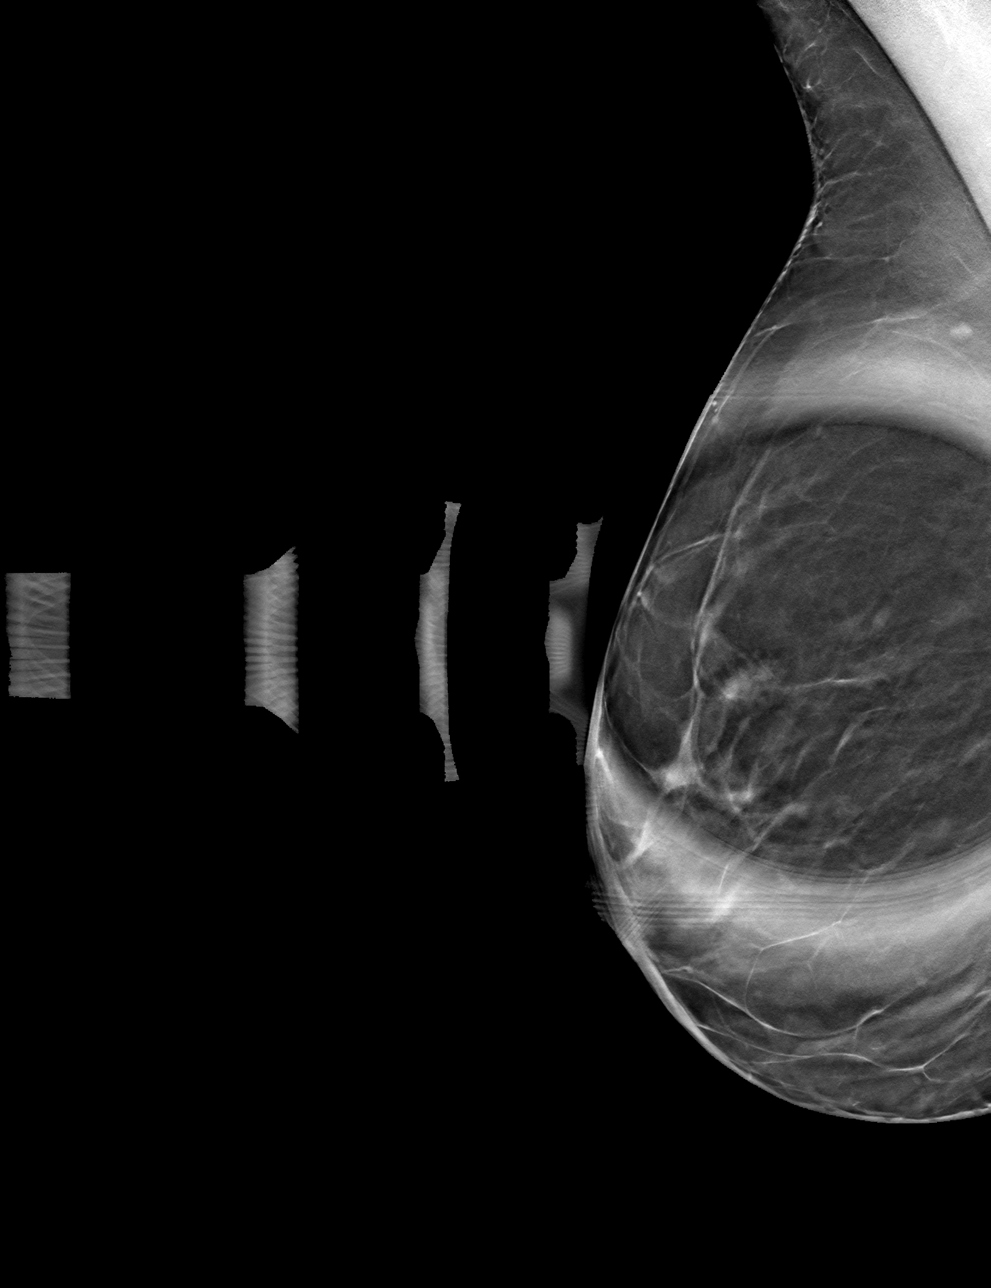

[4 of 12 positions shown; findings below may reference images not displayed]

ACR Breast Density Category b: There are scattered areas of
fibroglandular density.
FINDINGS: Spot compression tomosynthesis MLO and full field mL tomosynthesis
views of the right breast were performed for a questioned asymmetry
seen only on the MLO view in the superior right breast. On the
additional imaging the mammographic appearance of this area is
stable dating back to 1010, consistent with a benign process. There
is no new abnormality.
IMPRESSION: No mammographic evidence of malignancy in the right breast.

RECOMMENDATION:
Screening mammogram in one year.(Code:[id number])

I have discussed the findings and recommendations with the patient.
If applicable, a reminder letter will be sent to the patient
regarding the next appointment.

BI-RADS CATEGORY  2: Benign.

## 2021-12-26 DIAGNOSIS — H2513 Age-related nuclear cataract, bilateral: Secondary | ICD-10-CM | POA: Diagnosis not present

## 2021-12-26 DIAGNOSIS — H04123 Dry eye syndrome of bilateral lacrimal glands: Secondary | ICD-10-CM | POA: Diagnosis not present

## 2021-12-27 DIAGNOSIS — Z23 Encounter for immunization: Secondary | ICD-10-CM | POA: Diagnosis not present

## 2022-05-18 DIAGNOSIS — Z23 Encounter for immunization: Secondary | ICD-10-CM | POA: Diagnosis not present

## 2022-06-15 DIAGNOSIS — Z23 Encounter for immunization: Secondary | ICD-10-CM | POA: Diagnosis not present

## 2022-06-22 ENCOUNTER — Other Ambulatory Visit: Payer: Self-pay | Admitting: Internal Medicine

## 2022-06-22 DIAGNOSIS — Z1231 Encounter for screening mammogram for malignant neoplasm of breast: Secondary | ICD-10-CM

## 2022-07-14 DIAGNOSIS — H903 Sensorineural hearing loss, bilateral: Secondary | ICD-10-CM | POA: Diagnosis not present

## 2022-07-16 ENCOUNTER — Ambulatory Visit (INDEPENDENT_AMBULATORY_CARE_PROVIDER_SITE_OTHER): Payer: Medicare Other

## 2022-07-16 VITALS — Ht 62.0 in | Wt 144.6 lb

## 2022-07-16 DIAGNOSIS — Z0000 Encounter for general adult medical examination without abnormal findings: Secondary | ICD-10-CM

## 2022-07-16 NOTE — Patient Instructions (Signed)
Katherine Heath , Thank you for taking time to come for your Medicare Wellness Visit. I appreciate your ongoing commitment to your health goals. Please review the following plan we discussed and let me know if I can assist you in the future.   Screening recommendations/referrals: Colonoscopy: completed 01/03/2016, due 01/02/2026 Mammogram: scheduled fot 08/18/2022 Bone Density: completed 04/05/2019 Recommended yearly ophthalmology/optometry visit for glaucoma screening and checkup Recommended yearly dental visit for hygiene and checkup  Vaccinations: Influenza vaccine: completed Pneumococcal vaccine: due Tdap vaccine: completed 12/14/2018, due 12/13/2028 Shingles vaccine: completed   Covid-19:completed  Advanced directives: Please bring a copy of your POA (Power of Attorney) and/or Living Will to your next appointment.   Conditions/risks identified: none  Next appointment: Follow up in one year for your annual wellness visit    Preventive Care 65 Years and Older, Female Preventive care refers to lifestyle choices and visits with your health care provider that can promote health and wellness. What does preventive care include? A yearly physical exam. This is also called an annual well check. Dental exams once or twice a year. Routine eye exams. Ask your health care provider how often you should have your eyes checked. Personal lifestyle choices, including: Daily care of your teeth and gums. Regular physical activity. Eating a healthy diet. Avoiding tobacco and drug use. Limiting alcohol use. Practicing safe sex. Taking low-dose aspirin every day. Taking vitamin and mineral supplements as recommended by your health care provider. What happens during an annual well check? The services and screenings done by your health care provider during your annual well check will depend on your age, overall health, lifestyle risk factors, and family history of disease. Counseling  Your health care  provider may ask you questions about your: Alcohol use. Tobacco use. Drug use. Emotional well-being. Home and relationship well-being. Sexual activity. Eating habits. History of falls. Memory and ability to understand (cognition). Work and work Statistician. Reproductive health. Screening  You may have the following tests or measurements: Height, weight, and BMI. Blood pressure. Lipid and cholesterol levels. These may be checked every 5 years, or more frequently if you are over 63 years old. Skin check. Lung cancer screening. You may have this screening every year starting at age 9 if you have a 30-pack-year history of smoking and currently smoke or have quit within the past 15 years. Fecal occult blood test (FOBT) of the stool. You may have this test every year starting at age 54. Flexible sigmoidoscopy or colonoscopy. You may have a sigmoidoscopy every 5 years or a colonoscopy every 10 years starting at age 38. Hepatitis C blood test. Hepatitis B blood test. Sexually transmitted disease (STD) testing. Diabetes screening. This is done by checking your blood sugar (glucose) after you have not eaten for a while (fasting). You may have this done every 1-3 years. Bone density scan. This is done to screen for osteoporosis. You may have this done starting at age 34. Mammogram. This may be done every 1-2 years. Talk to your health care provider about how often you should have regular mammograms. Talk with your health care provider about your test results, treatment options, and if necessary, the need for more tests. Vaccines  Your health care provider may recommend certain vaccines, such as: Influenza vaccine. This is recommended every year. Tetanus, diphtheria, and acellular pertussis (Tdap, Td) vaccine. You may need a Td booster every 10 years. Zoster vaccine. You may need this after age 89. Pneumococcal 13-valent conjugate (PCV13) vaccine. One dose is recommended  after age  61. Pneumococcal polysaccharide (PPSV23) vaccine. One dose is recommended after age 62. Talk to your health care provider about which screenings and vaccines you need and how often you need them. This information is not intended to replace advice given to you by your health care provider. Make sure you discuss any questions you have with your health care provider. Document Released: 08/30/2015 Document Revised: 04/22/2016 Document Reviewed: 06/04/2015 Elsevier Interactive Patient Education  2017 [place] Prevention in the Home Falls can cause injuries. They can happen to people of all ages. There are many things you can do to make your home safe and to help prevent falls. What can I do on the outside of my home? Regularly fix the edges of walkways and driveways and fix any cracks. Remove anything that might make you trip as you walk through a door, such as a raised step or threshold. Trim any bushes or trees on the path to your home. Use bright outdoor lighting. Clear any walking paths of anything that might make someone trip, such as rocks or tools. Regularly check to see if handrails are loose or broken. Make sure that both sides of any steps have handrails. Any raised decks and porches should have guardrails on the edges. Have any leaves, snow, or ice cleared regularly. Use sand or salt on walking paths during winter. Clean up any spills in your garage right away. This includes oil or grease spills. What can I do in the bathroom? Use night lights. Install grab bars by the toilet and in the tub and shower. Do not use towel bars as grab bars. Use non-skid mats or decals in the tub or shower. If you need to sit down in the shower, use a plastic, non-slip stool. Keep the floor dry. Clean up any water that spills on the floor as soon as it happens. Remove soap buildup in the tub or shower regularly. Attach bath mats securely with double-sided non-slip rug tape. Do not have throw  rugs and other things on the floor that can make you trip. What can I do in the bedroom? Use night lights. Make sure that you have a light by your bed that is easy to reach. Do not use any sheets or blankets that are too big for your bed. They should not hang down onto the floor. Have a firm chair that has side arms. You can use this for support while you get dressed. Do not have throw rugs and other things on the floor that can make you trip. What can I do in the kitchen? Clean up any spills right away. Avoid walking on wet floors. Keep items that you use a lot in easy-to-reach places. If you need to reach something above you, use a strong step stool that has a grab bar. Keep electrical cords out of the way. Do not use floor polish or wax that makes floors slippery. If you must use wax, use non-skid floor wax. Do not have throw rugs and other things on the floor that can make you trip. What can I do with my stairs? Do not leave any items on the stairs. Make sure that there are handrails on both sides of the stairs and use them. Fix handrails that are broken or loose. Make sure that handrails are as long as the stairways. Check any carpeting to make sure that it is firmly attached to the stairs. Fix any carpet that is loose or worn. Avoid having throw  rugs at the top or bottom of the stairs. If you do have throw rugs, attach them to the floor with carpet tape. Make sure that you have a light switch at the top of the stairs and the bottom of the stairs. If you do not have them, ask someone to add them for you. What else can I do to help prevent falls? Wear shoes that: Do not have high heels. Have rubber bottoms. Are comfortable and fit you well. Are closed at the toe. Do not wear sandals. If you use a stepladder: Make sure that it is fully opened. Do not climb a closed stepladder. Make sure that both sides of the stepladder are locked into place. Ask someone to hold it for you, if  possible. Clearly mark and make sure that you can see: Any grab bars or handrails. First and last steps. Where the edge of each step is. Use tools that help you move around (mobility aids) if they are needed. These include: Canes. Walkers. Scooters. Crutches. Turn on the lights when you go into a dark area. Replace any light bulbs as soon as they burn out. Set up your furniture so you have a clear path. Avoid moving your furniture around. If any of your floors are uneven, fix them. If there are any pets around you, be aware of where they are. Review your medicines with your doctor. Some medicines can make you feel dizzy. This can increase your chance of falling. Ask your doctor what other things that you can do to help prevent falls. This information is not intended to replace advice given to you by your health care provider. Make sure you discuss any questions you have with your health care provider. Document Released: 05/30/2009 Document Revised: 01/09/2016 Document Reviewed: 09/07/2014 Elsevier Interactive Patient Education  2017 [employer].

## 2022-07-16 NOTE — Progress Notes (Signed)
I connected with Katherine Heath today by telephone and verified that I am speaking with the correct person using two identifiers. Location patient: home Location provider: work Persons participating in the virtual visit: Rosetta, Rupnow LPN.   I discussed the limitations, risks, security and privacy concerns of performing an evaluation and management service by telephone and the availability of in person appointments. I also discussed with the patient that there may be a patient responsible charge related to this service. The patient expressed understanding and verbally consented to this telephonic visit.    Interactive audio and video telecommunications were attempted between this provider and patient, however failed, due to patient having technical difficulties OR patient did not have access to video capability.  We continued and completed visit with audio only.     Vital signs may be patient reported or missing.  Subjective:   Katherine Heath is a 71 y.o. female who presents for Medicare Annual (Subsequent) preventive examination.  Review of Systems     Cardiac Risk Factors include: advanced age (>24mn, >>22women);dyslipidemia     Objective:    Today's Vitals   07/16/22 1400  Weight: 144 lb 9.6 oz (65.6 kg)  Height: '5\' 2"'$  (1.575 m)   Body mass index is 26.45 kg/m.     07/16/2022    2:07 PM 06/04/2021   10:44 AM 06/23/2020    7:31 AM 05/29/2020    9:17 AM 05/16/2019    8:45 AM  Advanced Directives  Does Patient Have a Medical Advance Directive? Yes Yes Yes Yes Yes  Type of AParamedicof A[place]Living will H[place]Living will H[place] WestLiving will HFlint CreekLiving will HT[place]Living will  Copy of HOmegain Chart? No - copy requested No - copy requested  No - copy requested No - copy requested    Current Medications  (verified) Outpatient Encounter Medications as of 07/16/2022  Medication Sig   Cholecalciferol (VITAMIN D-3 PO) Take by mouth daily. 6000 iu daily   NON FORMULARY DIM   Omega-3 Fatty Acids (OMEGA-3 FISH OIL) 1200 MG CAPS Take by mouth.   Red Yeast Rice Extract (RED YEAST RICE PO) Take 1 capsule by mouth 2 (two) times daily.   VITAMIN K PO Take by mouth.   BLACK COHOSH EXTRACT PO Take 1 tablet by mouth. 2 per day (Patient not taking: Reported on 07/16/2022)   ELDERBERRY PO Take 3 tablets by mouth. (Patient not taking: Reported on 07/16/2022)   magnesium oxide (MAG-OX) 400 MG tablet Take 400 mg by mouth daily. (Patient not taking: Reported on 06/04/2021)   No facility-administered encounter medications on file as of 07/16/2022.    Allergies (verified) Latex   History: Past Medical History:  Diagnosis Date   Arthritis    Depression    GERD (gastroesophageal reflux disease)    Hyperlipidemia 06/17/2017   Shortness of breath 11/23/2015   Past Surgical History:  Procedure Laterality Date   sphincterotomy  08/17/2001   WRIST SURGERY Left 06/2020   Family History  Problem Relation Age of Onset   Heart disease Father    Heart attack Father    Heart disease Maternal Grandmother    Hypertension Maternal Grandmother    Heart disease Paternal Grandfather    Stroke Mother    Stroke Maternal Grandfather    Social History   Socioeconomic History   Marital status: Married    Spouse name: Not on file  Number of children: Not on file   Years of education: Not on file   Highest education level: Not on file  Occupational History   Occupation: retired  Tobacco Use   Smoking status: Never   Smokeless tobacco: Never  Vaping Use   Vaping Use: Never used  Substance and Sexual Activity   Alcohol use: Not Currently    Alcohol/week: 0.0 standard drinks of alcohol   Drug use: No   Sexual activity: Not Currently  Other Topics Concern   Not on file  Social History Narrative   Not on  file   Social Determinants of Health   Financial Resource Strain: Low Risk  (07/16/2022)   Overall Financial Resource Strain (CARDIA)    Difficulty of Paying Living Expenses: Not hard at all  Food Insecurity: No Food Insecurity (07/16/2022)   Hunger Vital Sign    Worried About Running Out of Food in the Last Year: Never true    [place] in the Last Year: Never true  Transportation Needs: No Transportation Needs (07/16/2022)   PRAPARE - Hydrologist (Medical): No    Lack of Transportation (Non-Medical): No  Physical Activity: Sufficiently Active (07/16/2022)   Exercise Vital Sign    Days of Exercise per Week: 5 days    Minutes of Exercise per Session: 50 min  Stress: No Stress Concern Present (07/16/2022)   [place]    Feeling of Stress : Not at all  Social Connections: Not on file    Tobacco Counseling Counseling given: Not Answered   Clinical Intake:  Pre-visit preparation completed: Yes  Pain : No/denies pain     Nutritional Status: BMI 25 -29 Overweight Nutritional Risks: None Diabetes: No  How often do you need to have someone help you when you read instructions, pamphlets, or other written materials from your doctor or pharmacy?: 1 - Never  Diabetic? no  Interpreter Needed?: No  Information entered by :: NAllen LPN   Activities of Daily Living    07/16/2022    2:09 PM  In your present state of health, do you have any difficulty performing the following activities:  Hearing? 1  Vision? 1  Difficulty concentrating or making decisions? 0  Walking or climbing stairs? 1  Dressing or bathing? 0  Doing errands, shopping? 0  Preparing Food and eating ? N  Using the Toilet? N  In the past six months, have you accidently leaked urine? Y  Do you have problems with loss of bowel control? N  Managing your Medications? N  Managing your Finances? N   Housekeeping or managing your Housekeeping? N    Patient Care Team: [name], MD as PCP - General (Internal Medicine)  Indicate any recent Medical Services you may have received from other than [facility] providers in the past year (date may be approximate).     Assessment:   This is a routine wellness examination for Katherine Heath.  Hearing/Vision screen Vision Screening - Comments:: Regular eye exams, [facility]  Dietary issues and exercise activities discussed: Current Exercise Habits: Home exercise routine, Type of exercise: walking;yoga;calisthenics, Time (Minutes): 50, Frequency (Times/Week): 5, Weekly Exercise (Minutes/Week): 250   Goals Addressed             This Visit's Progress    Patient Stated       07/16/2022, wants to lose weight       Depression Screen  07/16/2022    2:09 PM 06/04/2021   10:47 AM 11/27/2020   11:33 AM 05/29/2020    9:19 AM 05/16/2019    8:45 AM 01/31/2019    2:01 PM 09/29/2018    2:11 PM  PHQ 2/9 Scores  PHQ - 2 Score 0 0 0 1 1 0 0  PHQ- 9 Score   '7 11 8      '$ Fall Risk    07/16/2022    2:08 PM 06/04/2021   10:45 AM 05/29/2020    9:18 AM 05/16/2019    8:45 AM 01/31/2019    2:01 PM  Fall Risk   Falls in the past year? '1 1 1 1 1  '$ Comment tripped tripping during walks slipped on mud or stumbled over a branch 4 falls due to tripping and slipping; walking on trails   Number falls in past yr: '1 1 1 1 1  '$ Injury with Fall? 0 1 0 0 0  Comment  broke wrist     Risk for fall due to : History of fall(s) History of fall(s);Impaired balance/gait History of fall(s) History of fall(s)   Follow up Falls evaluation completed;Education provided;Falls prevention discussed Falls evaluation completed;Education provided;Falls prevention discussed Falls evaluation completed;Education provided;Falls prevention discussed Falls evaluation completed;Education provided;Falls prevention discussed     FALL RISK PREVENTION PERTAINING TO THE HOME:  Any  stairs in or around the home? Yes  If so, are there any without handrails? No  Home free of loose throw rugs in walkways, pet beds, electrical cords, etc? Yes  Adequate lighting in your home to reduce risk of falls? Yes   ASSISTIVE DEVICES UTILIZED TO PREVENT FALLS:  Life alert? No  Use of a cane, walker or w/c? No  Grab bars in the bathroom? Yes  Shower chair or bench in shower? Yes  Elevated toilet seat or a handicapped toilet? No   TIMED UP AND GO:  Was the test performed? No .      Cognitive Function:        07/16/2022    2:10 PM 06/04/2021   10:49 AM 05/29/2020    9:27 AM 05/16/2019    8:58 AM  6CIT Screen  What Year? 0 points 0 points 0 points 0 points  What month? 0 points 0 points 0 points 0 points  What time? 0 points 0 points 0 points 0 points  Count back from 20 0 points 0 points 0 points 0 points  Months in reverse 0 points 0 points 0 points 0 points  Repeat phrase 0 points 0 points 0 points 0 points  Total Score 0 points 0 points 0 points 0 points    Immunizations Immunization History  Administered Date(s) Administered   Influenza, High Dose Seasonal PF 05/26/2018, 05/31/2021   PFIZER(Purple Top)SARS-COV-2 Vaccination 09/22/2019, 10/13/2019, 06/07/2020, 11/16/2020, 04/30/2021   Pneumococcal Conjugate-13 06/06/2018   Tdap 12/14/2018   Zoster Recombinat (Shingrix) 03/07/2020, 05/16/2020    TDAP status: Up to date  Flu Vaccine status: Up to date  Pneumococcal vaccine status: Due, Education has been provided regarding the importance of this vaccine. Advised may receive this vaccine at local pharmacy or Health Dept. Aware to provide a copy of the vaccination record if obtained from local pharmacy or Health Dept. Verbalized acceptance and understanding.  Covid-19 vaccine status: Completed vaccines  Qualifies for Shingles Vaccine? Yes   Zostavax completed Yes   Shingrix Completed?: Yes  Screening Tests Health Maintenance  Topic Date Due   Pneumonia  Vaccine 65+ Years  old (2 - PPSV23 or PCV20) 06/07/2019   INFLUENZA VACCINE  03/17/2022   COVID-19 Vaccine (6 - 2023-24 season) 04/17/2022   Medicare Annual Wellness (AWV)  06/04/2022   MAMMOGRAM  04/30/2023   COLONOSCOPY (Pts 45-36yr Insurance coverage will need to be confirmed)  01/02/2026   DTaP/Tdap/Td (2 - Td or Tdap) 12/13/2028   DEXA SCAN  Completed   Hepatitis C Screening  Completed   Zoster Vaccines- Shingrix  Completed   HPV VACCINES  Aged Out    Health Maintenance  Health Maintenance Due  Topic Date Due   Pneumonia Vaccine 71 Years old (2 - PPSV23 or PCV20) 06/07/2019   INFLUENZA VACCINE  03/17/2022   COVID-19 Vaccine (6 - 2023-24 season) 04/17/2022   Medicare Annual Wellness (AWV)  06/04/2022    Colorectal cancer screening: Type of screening: Colonoscopy. Completed 01/03/2016. Repeat every 10 years  Mammogram status: scheduled 08/18/2022  Bone Density status: Completed 04/05/2019.   Lung Cancer Screening: (Low Dose CT Chest recommended if Age 71-80years, 30 pack-year currently smoking OR have quit w/in 15years.) does not qualify.   Lung Cancer Screening Referral: no  Additional Screening:  Hepatitis C Screening: does qualify; Completed 03/01/2018  Vision Screening: Recommended annual ophthalmology exams for early detection of glaucoma and other disorders of the eye. Is the patient up to date with their annual eye exam?  Yes  Who is the provider or what is the name of the office in which the patient attends annual eye exams? [facility] If pt is not established with a provider, would they like to be referred to a provider to establish care? No .   Dental Screening: Recommended annual dental exams for proper oral hygiene  Community Resource Referral / Chronic Care Management: CRR required this visit?  No   CCM required this visit?  No      Plan:     I have personally reviewed and noted the following in the patient's chart:   Medical and social  history Use of alcohol, tobacco or illicit drugs  Current medications and supplements including opioid prescriptions. Patient is not currently taking opioid prescriptions. Functional ability and status Nutritional status Physical activity Advanced directives List of other physicians Hospitalizations, surgeries, and ER visits in previous 12 months Vitals Screenings to include cognitive, depression, and falls Referrals and appointments  In addition, I have reviewed and discussed with patient certain preventive protocols, quality metrics, and best practice recommendations. A written personalized care plan for preventive services as well as general preventive health recommendations were provided to patient.     NKellie Simmering LPN   [id number]  Nurse Notes: none  Due to this being a virtual visit, the after visit summary with patients personalized plan was offered to patient via mail or my-chart. Patient would like to access on my-chart

## 2022-08-18 ENCOUNTER — Ambulatory Visit
Admission: RE | Admit: 2022-08-18 | Discharge: 2022-08-18 | Disposition: A | Discharge status: Home / Self Care | Payer: Medicare Other | Source: Ambulatory Visit | Attending: Internal Medicine | Admitting: Internal Medicine

## 2022-08-18 DIAGNOSIS — Z1231 Encounter for screening mammogram for malignant neoplasm of breast: Secondary | ICD-10-CM

## 2022-09-01 ENCOUNTER — Encounter: Payer: Self-pay | Admitting: Internal Medicine

## 2022-09-01 ENCOUNTER — Ambulatory Visit (INDEPENDENT_AMBULATORY_CARE_PROVIDER_SITE_OTHER): Payer: Medicare Other | Admitting: Internal Medicine

## 2022-09-01 VITALS — BP 110/68 | HR 68 | Temp 98.0°F | Ht 61.6 in | Wt 147.2 lb

## 2022-09-01 DIAGNOSIS — Z79899 Other long term (current) drug therapy: Secondary | ICD-10-CM | POA: Diagnosis not present

## 2022-09-01 DIAGNOSIS — Z6826 Body mass index (BMI) 26.0-26.9, adult: Secondary | ICD-10-CM | POA: Insufficient documentation

## 2022-09-01 DIAGNOSIS — E7800 Pure hypercholesterolemia, unspecified: Secondary | ICD-10-CM

## 2022-09-01 DIAGNOSIS — Z6827 Body mass index (BMI) 27.0-27.9, adult: Secondary | ICD-10-CM | POA: Insufficient documentation

## 2022-09-01 DIAGNOSIS — E2839 Other primary ovarian failure: Secondary | ICD-10-CM | POA: Diagnosis not present

## 2022-09-01 DIAGNOSIS — N951 Menopausal and female climacteric states: Secondary | ICD-10-CM | POA: Diagnosis not present

## 2022-09-01 DIAGNOSIS — I700 Atherosclerosis of aorta: Secondary | ICD-10-CM

## 2022-09-01 DIAGNOSIS — E785 Hyperlipidemia, unspecified: Secondary | ICD-10-CM | POA: Diagnosis not present

## 2022-09-01 DIAGNOSIS — Z23 Encounter for immunization: Secondary | ICD-10-CM | POA: Diagnosis not present

## 2022-09-01 DIAGNOSIS — L989 Disorder of the skin and subcutaneous tissue, unspecified: Secondary | ICD-10-CM | POA: Diagnosis not present

## 2022-09-01 NOTE — Progress Notes (Signed)
Katherine Heath,acting as a Education administrator for Katherine Greenland, MD.,have documented all relevant documentation on the behalf of Katherine Greenland, MD,as directed by  Katherine Greenland, MD while in the presence of Katherine Greenland, MD.    Subjective:     Patient ID: Katherine Heath , female    DOB: [birth year] , 72 y.o.   MRN: [id number]   Chief Complaint  Patient presents with   hormones f/u    HPI  She is here today for f/u BHRT.  She is no longer on BHRT therapy. She was last seen in 2022.      Past Medical History:  Diagnosis Date   Arthritis    Depression    GERD (gastroesophageal reflux disease)    Hyperlipidemia 06/17/2017   Shortness of breath 11/23/2015     Family History  Problem Relation Age of Onset   Heart disease Father    Heart attack Father    Heart disease Maternal Grandmother    Hypertension Maternal Grandmother    Heart disease Paternal Grandfather    Stroke Mother    Stroke Maternal Grandfather      Current Outpatient Medications:    Cholecalciferol (VITAMIN D-3 PO), Take by mouth daily. 6000 iu daily, Disp: , Rfl:    NON FORMULARY, DIM, Disp: , Rfl:    Red Yeast Rice Extract (RED YEAST RICE PO), Take 1 capsule by mouth 2 (two) times daily., Disp: , Rfl:    VITAMIN K PO, Take by mouth., Disp: , Rfl:    Allergies  Allergen Reactions   Latex Itching     Review of Systems  Constitutional: Negative.   Respiratory: Negative.    Cardiovascular: Negative.   Gastrointestinal: Negative.   Skin:  Positive for rash.       SHE C/O NODULE UNDER HER RIGHT EYE. SHE FIRST NOTICED IT OVER 4 MONTHS AGO. SHE FEELS IT HAS INCREASED IN SIZE OVER THIS TIME PERIOD.   Neurological: Negative.   Psychiatric/Behavioral: Negative.       Today's Vitals   09/01/22 1147  BP: 110/68  Pulse: 68  Temp: 98 F (36.7 C)  Weight: 147 lb 3.2 oz (66.8 kg)  Height: 5' 1.6" (1.565 m)  PainSc: 0-No pain   Body mass index is 27.27 kg/m.  Wt Readings from Last 3 Encounters:   09/01/22 147 lb 3.2 oz (66.8 kg)  07/16/22 144 lb 9.6 oz (65.6 kg)  06/04/21 146 lb 12.8 oz (66.6 kg)     Objective:  Physical Exam Vitals and nursing note reviewed.  Constitutional:      Appearance: Normal appearance.  HENT:     Head: Normocephalic and atraumatic.     Nose:     Comments: Masked     Mouth/Throat:     Comments: Masked  Eyes:     Extraocular Movements: Extraocular movements intact.  Cardiovascular:     Rate and Rhythm: Normal rate and regular rhythm.     Heart sounds: Normal heart sounds.  Pulmonary:     Effort: Pulmonary effort is normal.     Breath sounds: Normal breath sounds.  Musculoskeletal:     Cervical back: Normal range of motion.  Skin:    General: Skin is warm.     Comments: FLESH COLORED PAPULAR LESION UNDERNEATH RIGHT EYE. APPEARS SCALY. NO OVERLYING ERYTHEMA  Neurological:     General: No focal deficit present.     Mental Status: She is alert.  Psychiatric:  Mood and Affect: Mood normal.        Behavior: Behavior normal.      Assessment And Plan:     1. Female climacteric state Comments: She is no longer on BHRT, now on DIM supplementation. She is ready to be weaned off, will start qod dosing. I will check estradiol level today, f/u in 6 months. - CMP14+EGFR - Estradiol  2. Aortic atherosclerosis (HCC) Comments: Chronic, LDL goal <70.  She does not wish to take statin therapy. She has been taking RYR. - Lipid panel  3. Pure hypercholesterolemia Comments: Chronic, please see above. I will also check TSH today. - Lipid panel - TSH  4. Skin lesion of face Comments: I will refer her to Derm for further evaluation/excision. - Ambulatory referral to Dermatology  5. Estrogen deficiency Comments: I will refer her for bone density. Encouraged to engage in weightbearing exercise at least 3x/week. - DG Bone Density; Future  6. BMI 27.0-27.9,adult Comments: She is encouraged to aim for at least 150 minutes of exercise per  week.  7. Immunization due - Pneumococcal polysaccharide vaccine 23-valent greater than or equal to 2yo subcutaneous/IM  8. Drug therapy - CBC no Diff    Patient was given opportunity to ask questions. Patient verbalized understanding of the plan and was able to repeat key elements of the plan. All questions were answered to their satisfaction.   I, Katherine Greenland, MD, have reviewed all documentation for this visit. The documentation on 09/01/22 for the exam, diagnosis, procedures, and orders are all accurate and complete.   IF YOU HAVE BEEN REFERRED TO A SPECIALIST, IT MAY TAKE 1-2 WEEKS TO SCHEDULE/PROCESS THE REFERRAL. IF YOU HAVE NOT HEARD FROM US/SPECIALIST IN TWO WEEKS, PLEASE GIVE [place] A CALL AT [phone].   THE PATIENT IS ENCOURAGED TO PRACTICE SOCIAL DISTANCING DUE TO THE COVID-19 PANDEMIC.

## 2022-09-01 NOTE — Patient Instructions (Signed)
Mediterranean Diet A Mediterranean diet refers to food and lifestyle choices that are based on the traditions of countries located on the [employer]. It focuses on eating more fruits, vegetables, whole grains, beans, nuts, seeds, and heart-healthy fats, and eating less dairy, meat, eggs, and processed foods with added sugar, salt, and fat. This way of eating has been shown to help prevent certain conditions and improve outcomes for people who have chronic diseases, like kidney disease and heart disease. What are tips for following this plan? Reading food labels Check the serving size of packaged foods. For foods such as rice and pasta, the serving size refers to the amount of cooked product, not dry. Check the total fat in packaged foods. Avoid foods that have saturated fat or trans fats. Check the ingredient list for added sugars, such as corn syrup. Shopping  Buy a variety of foods that offer a balanced diet, including: Fresh fruits and vegetables (produce). Grains, beans, nuts, and seeds. Some of these may be available in unpackaged forms or large amounts (in bulk). Fresh seafood. Poultry and eggs. Low-fat dairy products. Buy whole ingredients instead of prepackaged foods. Buy fresh fruits and vegetables in-season from local farmers markets. Buy plain frozen fruits and vegetables. If you do not have access to quality fresh seafood, buy precooked frozen shrimp or canned fish, such as tuna, salmon, or sardines. Stock your pantry so you always have certain foods on hand, such as olive oil, canned tuna, canned tomatoes, rice, pasta, and beans. Cooking Cook foods with extra-virgin olive oil instead of using butter or other vegetable oils. Have meat as a side dish, and have vegetables or grains as your main dish. This means having meat in small portions or adding small amounts of meat to foods like pasta or stew. Use beans or vegetables instead of meat in common dishes like chili or  lasagna. Experiment with different cooking methods. Try roasting, broiling, steaming, and sauting vegetables. Add frozen vegetables to soups, stews, pasta, or rice. Add nuts or seeds for added healthy fats and plant protein at each meal. You can add these to yogurt, salads, or vegetable dishes. Marinate fish or vegetables using olive oil, lemon juice, garlic, and fresh herbs. Meal planning Plan to eat one vegetarian meal one day each week. Try to work up to two vegetarian meals, if possible. Eat seafood two or more times a week. Have healthy snacks readily available, such as: Vegetable sticks with hummus. Greek yogurt. Fruit and nut trail mix. Eat balanced meals throughout the week. This includes: Fruit: 2-3 servings a day. Vegetables: 4-5 servings a day. Low-fat dairy: 2 servings a day. Fish, poultry, or lean meat: 1 serving a day. Beans and legumes: 2 or more servings a week. Nuts and seeds: 1-2 servings a day. Whole grains: 6-8 servings a day. Extra-virgin olive oil: 3-4 servings a day. Limit red meat and sweets to only a few servings a month. Lifestyle  Cook and eat meals together with your family, when possible. Drink enough fluid to keep your urine pale yellow. Be physically active every day. This includes: Aerobic exercise like running or swimming. Leisure activities like gardening, walking, or housework. Get 7-8 hours of sleep each night. If recommended by your health care provider, drink red wine in moderation. This means 1 glass a day for nonpregnant women and 2 glasses a day for men. A glass of wine equals 5 oz (150 mL). What foods should I eat? Fruits Apples. Apricots. Avocado. Berries. Bananas. Cherries. Dates.  Figs. Grapes. Lemons. Melon. Oranges. Peaches. Plums. Pomegranate. ?Vegetables ?Artichokes. Beets. Broccoli. Cabbage. Carrots. Eggplant. Green beans. Chard. Kale. Spinach. Onions. Leeks. Peas. Squash. Tomatoes. Peppers. Radishes. ?Grains ?Whole-grain pasta. Brown  rice. Bulgur wheat. Polenta. Couscous. Whole-wheat bread. Oatmeal. Quinoa. ?Meats and other proteins ?Beans. Almonds. Sunflower seeds. Pine nuts. Peanuts. Cod. Salmon. Scallops. Shrimp. Tuna. Tilapia. Clams. Oysters. Eggs. Poultry without skin. ?Dairy ?Low-fat milk. Cheese. Greek yogurt. ?Fats and oils ?Extra-virgin olive oil. Avocado oil. Grapeseed oil. ?Beverages ?Water. Red wine. Herbal tea. ?Sweets and desserts ?Greek yogurt with honey. Baked apples. Poached pears. Trail mix. ?Seasonings and condiments ?Basil. Cilantro. Coriander. Cumin. Mint. Parsley. Sage. Rosemary. Tarragon. Garlic. Oregano. Thyme. Pepper. Balsamic vinegar. Tahini. Hummus. Tomato sauce. Olives. Mushrooms. ?The items listed above may not be a complete list of foods and beverages you can eat. Contact a dietitian for more information. ?What foods should I limit? ?This is a list of foods that should be eaten rarely or only on special occasions. ?Fruits ?Fruit canned in syrup. ?Vegetables ?Deep-fried potatoes (french fries). ?Grains ?Prepackaged pasta or rice dishes. Prepackaged cereal with added sugar. Prepackaged snacks with added sugar. ?Meats and other proteins ?Beef. Pork. Lamb. Poultry with skin. Hot dogs. Bacon. ?Dairy ?Ice cream. Sour cream. Whole milk. ?Fats and oils ?Butter. Canola oil. Vegetable oil. Beef fat (tallow). Lard. ?Beverages ?Juice. Sugar-sweetened soft drinks. Beer. Liquor and spirits. ?Sweets and desserts ?Cookies. Cakes. Pies. Candy. ?Seasonings and condiments ?Mayonnaise. Pre-made sauces and marinades. ?The items listed above may not be a complete list of foods and beverages you should limit. Contact a dietitian for more information. ?Summary ?The Mediterranean diet includes both food and lifestyle choices. ?Eat a variety of fresh fruits and vegetables, beans, nuts, seeds, and whole grains. ?Limit the amount of red meat and sweets that you eat. ?If recommended by your health care provider, drink red wine in moderation.  This means 1 glass a day for nonpregnant women and 2 glasses a day for men. A glass of wine equals 5 oz (150 mL). ?This information is not intended to replace advice given to you by your health care provider. Make sure you discuss any questions you have with your health care provider. ?Document Revised: 09/08/2019 Document Reviewed: 07/06/2019 ?Elsevier Patient Education ? 2023 Elsevier Inc. ? ?

## 2022-09-02 LAB — CMP14+EGFR
ALT: 18 IU/L (ref 0–32)
AST: 21 IU/L (ref 0–40)
Albumin/Globulin Ratio: 2 (ref 1.2–2.2)
Albumin: 4.4 g/dL (ref 3.8–4.8)
Alkaline Phosphatase: 72 IU/L (ref 44–121)
BUN/Creatinine Ratio: 33 — ABNORMAL HIGH (ref 12–28)
BUN: 25 mg/dL (ref 8–27)
Bilirubin Total: <0.2 mg/dL (ref 0.0–1.2)
CO2: 23 mmol/L (ref 20–29)
Calcium: 9.6 mg/dL (ref 8.7–10.3)
Chloride: 99 mmol/L (ref 96–106)
Creatinine, Ser: 0.76 mg/dL (ref 0.57–1.00)
Globulin, Total: 2.2 g/dL (ref 1.5–4.5)
Glucose: 92 mg/dL (ref 70–99)
Potassium: 5.2 mmol/L (ref 3.5–5.2)
Sodium: 136 mmol/L (ref 134–144)
Total Protein: 6.6 g/dL (ref 6.0–8.5)
eGFR: 84 mL/min/{1.73_m2} (ref >59)

## 2022-09-02 LAB — CBC
Hematocrit: 43.5 % (ref 34.0–46.6)
Hemoglobin: 14.7 g/dL (ref 11.1–15.9)
MCH: 32.2 pg (ref 26.6–33.0)
MCHC: 33.8 g/dL (ref 31.5–35.7)
MCV: 95 fL (ref 79–97)
Platelets: 282 10*3/uL (ref 150–450)
RBC: 4.57 x10E6/uL (ref 3.77–5.28)
RDW: 11.9 % (ref 11.7–15.4)
WBC: 7.2 10*3/uL (ref 3.4–10.8)

## 2022-09-02 LAB — TSH: TSH: 1.64 u[IU]/mL (ref 0.450–4.500)

## 2022-09-02 LAB — ESTRADIOL: Estradiol: 8.8 pg/mL (ref 0.0–54.7)

## 2022-09-03 LAB — SPECIMEN STATUS REPORT

## 2022-09-03 LAB — LIPID PANEL
Chol/HDL Ratio: 3.8 ratio (ref 0.0–4.4)
Cholesterol, Total: 208 mg/dL — ABNORMAL HIGH (ref 100–199)
HDL: 55 mg/dL (ref >39)
LDL Chol Calc (NIH): 117 mg/dL — ABNORMAL HIGH (ref 0–99)
Triglycerides: 205 mg/dL — ABNORMAL HIGH (ref 0–149)
VLDL Cholesterol Cal: 36 mg/dL (ref 5–40)

## 2022-09-07 ENCOUNTER — Other Ambulatory Visit: Payer: Self-pay | Admitting: Internal Medicine

## 2022-09-07 DIAGNOSIS — E7800 Pure hypercholesterolemia, unspecified: Secondary | ICD-10-CM

## 2022-09-07 DIAGNOSIS — I700 Atherosclerosis of aorta: Secondary | ICD-10-CM

## 2022-10-08 ENCOUNTER — Ambulatory Visit (HOSPITAL_BASED_OUTPATIENT_CLINIC_OR_DEPARTMENT_OTHER)
Admission: RE | Admit: 2022-10-08 | Discharge: 2022-10-08 | Disposition: A | Discharge status: Home / Self Care | Payer: Medicare Other | Source: Ambulatory Visit | Attending: Internal Medicine | Admitting: Internal Medicine

## 2022-10-08 DIAGNOSIS — I700 Atherosclerosis of aorta: Secondary | ICD-10-CM | POA: Insufficient documentation

## 2022-10-12 ENCOUNTER — Other Ambulatory Visit: Payer: Self-pay

## 2022-10-12 ENCOUNTER — Emergency Department (HOSPITAL_BASED_OUTPATIENT_CLINIC_OR_DEPARTMENT_OTHER)
Admission: EM | Admit: 2022-10-12 | Discharge: 2022-10-12 | Disposition: A | Discharge status: Home / Self Care | Payer: Medicare Other | Attending: Emergency Medicine | Admitting: Emergency Medicine

## 2022-10-12 ENCOUNTER — Other Ambulatory Visit (HOSPITAL_BASED_OUTPATIENT_CLINIC_OR_DEPARTMENT_OTHER): Payer: Self-pay

## 2022-10-12 ENCOUNTER — Encounter (HOSPITAL_BASED_OUTPATIENT_CLINIC_OR_DEPARTMENT_OTHER): Payer: Self-pay

## 2022-10-12 DIAGNOSIS — R0789 Other chest pain: Secondary | ICD-10-CM | POA: Insufficient documentation

## 2022-10-12 DIAGNOSIS — Z91040 Latex allergy status: Secondary | ICD-10-CM | POA: Insufficient documentation

## 2022-10-12 DIAGNOSIS — R079 Chest pain, unspecified: Secondary | ICD-10-CM | POA: Diagnosis present

## 2022-10-12 MED ORDER — SUCRALFATE 1 G PO TABS
1.0000 g | ORAL_TABLET | Freq: Three times a day (TID) | ORAL | 0 refills | Status: DC
  Filled 2022-10-12: qty 56, 14d supply, fill #0

## 2022-10-12 MED ORDER — PANTOPRAZOLE SODIUM 20 MG PO TBEC
20.0000 mg | DELAYED_RELEASE_TABLET | Freq: Every day | ORAL | 0 refills | Status: AC
  Filled 2022-10-12: qty 30, 30d supply, fill #0

## 2022-10-12 NOTE — ED Triage Notes (Signed)
Patient here POV from Home.  Endorses Mostly Right Sided/Right Lateral CP for approximately 2 weeks. Was Intermittent and Dull. More Constant now.  No SOB. No N/V/D.   NAD Noted During Triage. A&Ox4. GCS 15. Ambulatory.

## 2022-10-12 NOTE — ED Provider Notes (Signed)
[facility] Provider Note   CSN: [id number] Arrival date & time: 10/12/22  1149     History  Chief Complaint  Patient presents with   Chest Pain    Katherine Heath is a 72 y.o. female.  Patient here with chest discomfort that is been ongoing for the last 2 weeks.  She does have recent cardiac workup.  She denies any nausea vomiting diarrhea.  No abdominal pain.  No shortness of breath.  No recent surgery or travel.  No history of cancer.  Nothing makes it worse or better.  May be associated with eating.  The history is provided by the patient.       Home Medications Prior to Admission medications   Medication Sig Start Date End Date Taking? Authorizing Provider  pantoprazole (PROTONIX) 20 MG tablet Take 1 tablet (20 mg total) by mouth daily. 10/12/22 11/11/22 Yes Krystian Ferrentino, DO  sucralfate (CARAFATE) 1 g tablet Take 1 tablet (1 g total) by mouth 4 (four) times daily -  with meals and at bedtime for 14 days. 10/12/22 10/26/22 Yes Charlaine Utsey, DO  Cholecalciferol (VITAMIN D-3 PO) Take by mouth daily. 6000 iu daily    [provider]  NON FORMULARY DIM    [provider]  Red Yeast Rice Extract (RED YEAST RICE PO) Take 1 capsule by mouth 2 (two) times daily.    [provider]  VITAMIN K PO Take by mouth.    [provider]      Allergies    Latex    Review of Systems   Review of Systems  Physical Exam Updated Vital Signs BP (!) 164/102 (BP Location: Right Arm)   Pulse 77   Temp 98.2 F (36.8 C) (Oral)   Resp 18   Ht '5\' 2"'$  (1.575 m)   Wt 64.9 kg   SpO2 97%   BMI 26.16 kg/m  Physical Exam Vitals and nursing note reviewed.  Constitutional:      General: She is not in acute distress.    Appearance: She is well-developed.  HENT:     Head: Normocephalic and atraumatic.  Eyes:     Conjunctiva/sclera: Conjunctivae normal.  Cardiovascular:     Rate and Rhythm: Normal rate and regular  rhythm.     Pulses:          Radial pulses are 2+ on the right side and 2+ on the left side.     Heart sounds: No murmur heard. Pulmonary:     Effort: Pulmonary effort is normal. No respiratory distress.     Breath sounds: Normal breath sounds.  Abdominal:     Palpations: Abdomen is soft.     Tenderness: There is no abdominal tenderness.  Musculoskeletal:        General: No swelling. Normal range of motion.     Cervical back: Neck supple.  Skin:    General: Skin is warm and dry.     Capillary Refill: Capillary refill takes less than 2 seconds.  Neurological:     General: No focal deficit present.     Mental Status: She is alert.  Psychiatric:        Mood and Affect: Mood normal.     ED Results / Procedures / Treatments   Labs (all labs ordered are listed, but only abnormal results are displayed) Labs Reviewed - No data to display  EKG EKG Interpretation  Date/Time:  Monday October 12 2022 11:55:19 EST Ventricular Rate:  73 PR Interval:  162 QRS Duration: 86 QT Interval:  364 QTC Calculation: 401 R Axis:   -17 Text Interpretation: Normal sinus rhythm Normal ECG No previous ECGs available Confirmed by Lennice Sites ([id number]) on 10/12/2022 11:57:56 AM  Radiology No results found.  Procedures Procedures    Medications Ordered in ED Medications - No data to display  ED Course/ Medical Decision Making/ A&P                             Medical Decision Making Risk Prescription drug management.   [name] is here with chest discomfort.  Unremarkable vitals.  No fever.  EKG shows sinus rhythm.  No ischemic changes.  Per chart review about 3 days ago she did have coronary CT done that was normal.  She had a calcium score of 0.  Overall she had excellent vessels and there is no evidence of CAD.  She did have a large hiatal hernia and I do truly suspect that this is GI related discomfort.  She has no PE risk factors.  He is Wells criteria 0.  Overall we will start  the patient on Carafate and Protonix and refer her to GI.  Ultimately have no concern for dissection or PE or other acute emergent process at this time.  This chart was dictated using voice recognition software.  Despite best efforts to proofread,  errors can occur which can change the documentation meaning.         Final Clinical Impression(s) / ED Diagnoses Final diagnoses:  Atypical chest pain    Rx / DC Orders ED Discharge Orders          Ordered    sucralfate (CARAFATE) 1 g tablet  3 times daily with meals & bedtime        10/12/22 1221    pantoprazole (PROTONIX) 20 MG tablet  Daily        10/12/22 [place], [place], DO 10/12/22 1222

## 2022-10-12 NOTE — ED Notes (Signed)
Discharge instructions, follow up care, and prescriptions reviewed and explained, pt verbalized understanding and had no further questions on d/c. Pt caox4, ambulatory, NAD.

## 2022-10-13 ENCOUNTER — Encounter: Payer: Self-pay | Admitting: Internal Medicine

## 2022-10-13 ENCOUNTER — Ambulatory Visit (INDEPENDENT_AMBULATORY_CARE_PROVIDER_SITE_OTHER): Payer: Medicare Other | Admitting: Internal Medicine

## 2022-10-13 VITALS — BP 118/78 | HR 64 | Temp 97.7°F | Ht 62.0 in | Wt 146.2 lb

## 2022-10-13 DIAGNOSIS — R030 Elevated blood-pressure reading, without diagnosis of hypertension: Secondary | ICD-10-CM

## 2022-10-13 DIAGNOSIS — K2100 Gastro-esophageal reflux disease with esophagitis, without bleeding: Secondary | ICD-10-CM | POA: Diagnosis not present

## 2022-10-13 DIAGNOSIS — K449 Diaphragmatic hernia without obstruction or gangrene: Secondary | ICD-10-CM | POA: Diagnosis not present

## 2022-10-13 DIAGNOSIS — Z6826 Body mass index (BMI) 26.0-26.9, adult: Secondary | ICD-10-CM | POA: Diagnosis not present

## 2022-10-13 DIAGNOSIS — R0789 Other chest pain: Secondary | ICD-10-CM | POA: Diagnosis not present

## 2022-10-13 NOTE — Patient Instructions (Signed)
How to Take Your Blood Pressure Blood pressure measures how strongly your blood is pressing against the walls of your arteries. Arteries are blood vessels that carry blood from your heart throughout your body. You can take your blood pressure at home with a machine. You may need to check your blood pressure at home: To check if you have high blood pressure (hypertension). To check your blood pressure over time. To make sure your blood pressure medicine is working. Supplies needed: Blood pressure machine, or monitor. A chair to sit in. This should be a chair where you can sit upright with your back supported. Do not sit on a soft couch or an armchair. Table or desk. Small notebook. Pencil or pen. How to prepare Avoid these things for 30 minutes before checking your blood pressure: Having drinks with caffeine in them, such as coffee or tea. Drinking alcohol. Eating. Smoking. Exercising. Do these things five minutes before checking your blood pressure: Go to the bathroom and pee (urinate). Sit in a chair. Be quiet. Do not talk. How to take your blood pressure Follow the instructions that came with your machine. If you have a digital blood pressure monitor, these may be the instructions: Sit up straight. Place your feet on the floor. Do not cross your ankles or legs. Rest your left arm at the level of your heart. You may rest it on a table, desk, or chair. Pull up your shirt sleeve. Wrap the blood pressure cuff around the upper part of your left arm. The cuff should be 1 inch (2.5 cm) above your elbow. It is best to wrap the cuff around bare skin. Fit the cuff snugly around your arm, but not too tightly. You should be able to place only one finger between the cuff and your arm. Place the cord so that it rests in the bend of your elbow. Press the power button. Sit quietly while the cuff fills with air and loses air. Write down the numbers on the screen. Wait 2-3 minutes and then repeat  steps 1-10. What do the numbers mean? Two numbers make up your blood pressure. The first number is called systolic pressure. The second is called diastolic pressure. An example of a blood pressure reading is "120 over 80" (or 120/80). If you are an adult and do not have a medical condition, use this guide to find out if your blood pressure is normal: Normal First number: below 120. Second number: below 80. Elevated First number: 120-129. Second number: below 80. Hypertension stage 1 First number: 130-139. Second number: 80-89. Hypertension stage 2 First number: 140 or above. Second number: 24 or above. Your blood pressure is above normal even if only the first or only the second number is above normal. Follow these instructions at home: Medicines Take over-the-counter and prescription medicines only as told by your doctor. Tell your doctor if your medicine is causing side effects. General instructions Check your blood pressure as often as your doctor tells you to. Check your blood pressure at the same time every day. Take your monitor to your next doctor's appointment. Your doctor will: Make sure you are using it correctly. Make sure it is working right. Understand what your blood pressure numbers should be. Keep all follow-up visits. General tips You will need a blood pressure machine or monitor. Your doctor can suggest a monitor. You can buy one at a drugstore or online. When choosing one: Choose one with an arm cuff. Choose one that wraps around your  upper arm. Only one finger should fit between your arm and the cuff. Do not choose one that measures your blood pressure from your wrist or finger. Where to find more information American Heart Association: www.heart.org Contact a doctor if: Your blood pressure keeps being high. Your blood pressure is suddenly low. Get help right away if: Your first blood pressure number is higher than 180. Your second blood pressure number is  higher than 120. These symptoms may be an emergency. Do not wait to see if the symptoms will go away. Get help right away. Call 911. Summary Check your blood pressure at the same time every day. Avoid caffeine, alcohol, smoking, and exercise for 30 minutes before checking your blood pressure. Make sure you understand what your blood pressure numbers should be. This information is not intended to replace advice given to you by your health care provider. Make sure you discuss any questions you have with your health care provider. Document Revised: 04/17/2021 Document Reviewed: 04/17/2021 Elsevier Patient Education  [place].

## 2022-10-13 NOTE — Progress Notes (Signed)
I,Victoria T Hamilton,acting as a scribe for [name], MD.,have documented all relevant documentation on the behalf of [name], MD,as directed by  [name], MD while in the presence of [name], MD.    Subjective:     Patient ID: Katherine Heath , female    DOB: 13-Oct-1950 , 72 y.o.   MRN: [id number]   Chief Complaint  Patient presents with   Blood Pressure Check    HPI  Pt presents today for blood pressure check. She states for the last couple of weeks her bp readings have read high. She does not know why. She has no prior h/o high blood pressure.  She does monitor bp at home. She denies change in her eating/sleep habits.   She went to the emergency room yesterday, 2/26 for further evaluation of chest pain. She describes it as a deep right-sided chest pain, described as dull, achy.  Denies associated diaphoresis, sob and palpitations.  She does report also having left achy ear  and head pain that accompanied the chest pain.  She was diagnosed with GERD & hiatal hernia.  Sucralfate & Pantoprazole prescribed yesterday. She has started the Sucralfate, she has yet to start Pantoprazole.      Past Medical History:  Diagnosis Date   Arthritis    Depression    GERD (gastroesophageal reflux disease)    Hyperlipidemia 06/17/2017   Shortness of breath 11/23/2015     Family History  Problem Relation Age of Onset   Heart disease Father    Heart attack Father    Heart disease Maternal Grandmother    Hypertension Maternal Grandmother    Heart disease Paternal Grandfather    Stroke Mother    Stroke Maternal Grandfather      Current Outpatient Medications:    Cholecalciferol (VITAMIN D-3 PO), Take by mouth daily. 6000 iu daily, Disp: , Rfl:    NON FORMULARY, DIM, Disp: , Rfl:    Red Yeast Rice Extract (RED YEAST RICE PO), Take 1 capsule by mouth 2 (two) times daily., Disp: , Rfl:    sucralfate (CARAFATE) 1 g tablet, Take 1 tablet (1 g total) by mouth 4 (four)  times daily -  with meals and at bedtime for 14 days., Disp: 56 tablet, Rfl: 0   VITAMIN K PO, Take by mouth., Disp: , Rfl:    pantoprazole (PROTONIX) 20 MG tablet, Take 1 tablet (20 mg total) by mouth daily. (Patient not taking: Reported on 10/13/2022), Disp: 30 tablet, Rfl: 0   Allergies  Allergen Reactions   Latex Itching     Review of Systems  Constitutional: Negative.   Respiratory: Negative.    Cardiovascular: Negative.   Gastrointestinal:  Positive for nausea.  Neurological: Negative.   Psychiatric/Behavioral: Negative.       Today's Vitals   10/13/22 0940  BP: 118/78  Pulse: 64  Temp: 97.7 F (36.5 C)  SpO2: 98%  Weight: 146 lb 3.2 oz (66.3 kg)  Height: '5\' 2"'$  (1.575 m)   Body mass index is 26.74 kg/m.  Wt Readings from Last 3 Encounters:  10/13/22 146 lb 3.2 oz (66.3 kg)  10/12/22 143 lb (64.9 kg)  09/01/22 147 lb 3.2 oz (66.8 kg)    BP Readings from Last 3 Encounters:  10/13/22 118/78  10/12/22 (!) 164/102  09/01/22 110/68     Objective:  Physical Exam Vitals and nursing note reviewed.  Constitutional:      Appearance: Normal appearance.  HENT:  Head: Normocephalic and atraumatic.     Nose:     Comments: Masked     Mouth/Throat:     Comments: Masked  Eyes:     Extraocular Movements: Extraocular movements intact.  Cardiovascular:     Rate and Rhythm: Normal rate and regular rhythm.     Heart sounds: Normal heart sounds.  Pulmonary:     Effort: Pulmonary effort is normal.     Breath sounds: Normal breath sounds.  Musculoskeletal:     Cervical back: Normal range of motion.  Skin:    General: Skin is warm.  Neurological:     General: No focal deficit present.     Mental Status: She is alert.  Psychiatric:        Mood and Affect: Mood normal.        Behavior: Behavior normal.       Assessment And Plan:     1. Atypical chest pain Comments: Recent ER notes reviewed. Her sx are likely due to GERD.  She recently had calcium scoring,  results are ZERO. Her sx are likely due to GI cause.  2. Gastroesophageal reflux disease with esophagitis without hemorrhage Comments: ER records reviewed in full detail. She was advised to start pantoprazole and take sucralfate as prescribed. - Ambulatory referral to Gastroenterology  3. Hiatal hernia Comments: I will refer her to GI for further evaluation. Pt advised she may have EGD performed. - Ambulatory referral to Gastroenterology  4. Elevated blood pressure reading Comments: BP has since improved. She is encouraged to follow low sodium diet, encouraged to decrease intake of packaged foods.  5. BMI 26.0-26.9,adult Comments: She is encouraged to aim for at least 150 minutes of exercise/week.     Patient was given opportunity to ask questions. Patient verbalized understanding of the plan and was able to repeat key elements of the plan. All questions were answered to their satisfaction.   I, [name], MD, have reviewed all documentation for this visit. The documentation on 10/13/22 for the exam, diagnosis, procedures, and orders are all accurate and complete.   IF YOU HAVE BEEN REFERRED TO A SPECIALIST, IT MAY TAKE 1-2 WEEKS TO SCHEDULE/PROCESS THE REFERRAL. IF YOU HAVE NOT HEARD FROM US/SPECIALIST IN TWO WEEKS, PLEASE GIVE [place] A CALL AT [phone].   THE PATIENT IS ENCOURAGED TO PRACTICE SOCIAL DISTANCING DUE TO THE COVID-19 PANDEMIC.

## 2022-10-19 ENCOUNTER — Telehealth: Payer: Self-pay

## 2022-10-19 NOTE — Telephone Encounter (Signed)
       Patient  visited [place] on   2/26   Telephone encounter attempt :  1st  A HIPAA compliant voice message was left requesting a return call.  Instructed patient to call back.    [facility] [phone] [address], [place], [place] [address] Phone: [phone] Email: [email or web address]

## 2022-10-20 DIAGNOSIS — L570 Actinic keratosis: Secondary | ICD-10-CM | POA: Diagnosis not present

## 2022-10-20 DIAGNOSIS — L821 Other seborrheic keratosis: Secondary | ICD-10-CM | POA: Diagnosis not present

## 2022-11-02 DIAGNOSIS — Z23 Encounter for immunization: Secondary | ICD-10-CM | POA: Diagnosis not present

## 2022-11-10 DIAGNOSIS — R1013 Epigastric pain: Secondary | ICD-10-CM | POA: Diagnosis not present

## 2022-11-10 DIAGNOSIS — K219 Gastro-esophageal reflux disease without esophagitis: Secondary | ICD-10-CM | POA: Diagnosis not present

## 2022-11-10 DIAGNOSIS — R1310 Dysphagia, unspecified: Secondary | ICD-10-CM | POA: Diagnosis not present

## 2022-11-10 DIAGNOSIS — K449 Diaphragmatic hernia without obstruction or gangrene: Secondary | ICD-10-CM | POA: Diagnosis not present

## 2022-11-10 DIAGNOSIS — K580 Irritable bowel syndrome with diarrhea: Secondary | ICD-10-CM | POA: Diagnosis not present

## 2022-11-11 ENCOUNTER — Other Ambulatory Visit: Payer: Self-pay | Admitting: Gastroenterology

## 2022-11-11 DIAGNOSIS — R1310 Dysphagia, unspecified: Secondary | ICD-10-CM

## 2022-12-04 ENCOUNTER — Ambulatory Visit
Admission: RE | Admit: 2022-12-04 | Discharge: 2022-12-04 | Disposition: A | Discharge status: Home / Self Care | Payer: Medicare Other | Source: Ambulatory Visit | Attending: Gastroenterology | Admitting: Gastroenterology

## 2022-12-04 ENCOUNTER — Encounter (INDEPENDENT_AMBULATORY_CARE_PROVIDER_SITE_OTHER): Payer: Self-pay

## 2022-12-04 DIAGNOSIS — K219 Gastro-esophageal reflux disease without esophagitis: Secondary | ICD-10-CM | POA: Diagnosis not present

## 2022-12-04 DIAGNOSIS — K449 Diaphragmatic hernia without obstruction or gangrene: Secondary | ICD-10-CM | POA: Diagnosis not present

## 2022-12-04 DIAGNOSIS — R1310 Dysphagia, unspecified: Secondary | ICD-10-CM | POA: Diagnosis not present

## 2022-12-14 DIAGNOSIS — K2289 Other specified disease of esophagus: Secondary | ICD-10-CM | POA: Diagnosis not present

## 2022-12-14 DIAGNOSIS — K2270 Barrett's esophagus without dysplasia: Secondary | ICD-10-CM | POA: Diagnosis not present

## 2022-12-14 DIAGNOSIS — K2970 Gastritis, unspecified, without bleeding: Secondary | ICD-10-CM | POA: Diagnosis not present

## 2022-12-14 DIAGNOSIS — K2100 Gastro-esophageal reflux disease with esophagitis, without bleeding: Secondary | ICD-10-CM | POA: Diagnosis not present

## 2022-12-14 DIAGNOSIS — R1310 Dysphagia, unspecified: Secondary | ICD-10-CM | POA: Diagnosis not present

## 2022-12-14 DIAGNOSIS — K449 Diaphragmatic hernia without obstruction or gangrene: Secondary | ICD-10-CM | POA: Diagnosis not present

## 2022-12-14 DIAGNOSIS — K219 Gastro-esophageal reflux disease without esophagitis: Secondary | ICD-10-CM | POA: Diagnosis not present

## 2022-12-14 DIAGNOSIS — R933 Abnormal findings on diagnostic imaging of other parts of digestive tract: Secondary | ICD-10-CM | POA: Diagnosis not present

## 2023-03-02 ENCOUNTER — Ambulatory Visit (INDEPENDENT_AMBULATORY_CARE_PROVIDER_SITE_OTHER): Payer: Medicare Other | Admitting: Internal Medicine

## 2023-03-02 ENCOUNTER — Encounter: Payer: Self-pay | Admitting: Internal Medicine

## 2023-03-02 VITALS — BP 120/70 | HR 75 | Temp 97.7°F | Ht 62.0 in | Wt 149.0 lb

## 2023-03-02 DIAGNOSIS — Z79899 Other long term (current) drug therapy: Secondary | ICD-10-CM | POA: Diagnosis not present

## 2023-03-02 DIAGNOSIS — Z6826 Body mass index (BMI) 26.0-26.9, adult: Secondary | ICD-10-CM | POA: Diagnosis not present

## 2023-03-02 DIAGNOSIS — K449 Diaphragmatic hernia without obstruction or gangrene: Secondary | ICD-10-CM | POA: Diagnosis not present

## 2023-03-02 DIAGNOSIS — E7800 Pure hypercholesterolemia, unspecified: Secondary | ICD-10-CM

## 2023-03-02 NOTE — Assessment & Plan Note (Signed)
She is on chronic PPI therapy. I will check vitamin B12 level today.

## 2023-03-02 NOTE — Assessment & Plan Note (Addendum)
Chronic, cardiac calcium scoring reviewed. Score is ZERO. However, she does have aortic atherosclerosis, which warrants statin therapy.  Again, she does not wish to pursue statin therapy.  She will continue with RYR and her regular exercise regimen.

## 2023-03-02 NOTE — Patient Instructions (Signed)
Mediterranean Diet ?A Mediterranean diet refers to food and lifestyle choices that are based on the traditions of countries located on the [place] Sea. It focuses on eating more fruits, vegetables, whole grains, beans, nuts, seeds, and heart-healthy fats, and eating less dairy, meat, eggs, and processed foods with added sugar, salt, and fat. This way of eating has been shown to help prevent certain conditions and improve outcomes for people who have chronic diseases, like kidney disease and heart disease. ?What are tips for following this plan? ?Reading food labels ?Check the serving size of packaged foods. For foods such as rice and pasta, the serving size refers to the amount of cooked product, not dry. ?Check the total fat in packaged foods. Avoid foods that have saturated fat or trans fats. ?Check the ingredient list for added sugars, such as corn syrup. ?Shopping ? ?Buy a variety of foods that offer a balanced diet, including: ?Fresh fruits and vegetables (produce). ?Grains, beans, nuts, and seeds. Some of these may be available in unpackaged forms or large amounts (in bulk). ?Fresh seafood. ?Poultry and eggs. ?Low-fat dairy products. ?Buy whole ingredients instead of prepackaged foods. ?Buy fresh fruits and vegetables in-season from local farmers markets. ?Buy plain frozen fruits and vegetables. ?If you do not have access to quality fresh seafood, buy precooked frozen shrimp or canned fish, such as tuna, salmon, or sardines. ?Stock your pantry so you always have certain foods on hand, such as olive oil, canned tuna, canned tomatoes, rice, pasta, and beans. ?Cooking ?Cook foods with extra-virgin olive oil instead of using butter or other vegetable oils. ?Have meat as a side dish, and have vegetables or grains as your main dish. This means having meat in small portions or adding small amounts of meat to foods like pasta or stew. ?Use beans or vegetables instead of meat in common dishes like chili or  lasagna. ?Experiment with different cooking methods. Try roasting, broiling, steaming, and saut?ing vegetables. ?Add frozen vegetables to soups, stews, pasta, or rice. ?Add nuts or seeds for added healthy fats and plant protein at each meal. You can add these to yogurt, salads, or vegetable dishes. ?Marinate fish or vegetables using olive oil, lemon juice, garlic, and fresh herbs. ?Meal planning ?Plan to eat one vegetarian meal one day each week. Try to work up to two vegetarian meals, if possible. ?Eat seafood two or more times a week. ?Have healthy snacks readily available, such as: ?Vegetable sticks with hummus. ?Greek yogurt. ?Fruit and nut trail mix. ?Eat balanced meals throughout the week. This includes: ?Fruit: 2-3 servings a day. ?Vegetables: 4-5 servings a day. ?Low-fat dairy: 2 servings a day. ?Fish, poultry, or lean meat: 1 serving a day. ?Beans and legumes: 2 or more servings a week. ?Nuts and seeds: 1-2 servings a day. ?Whole grains: 6-8 servings a day. ?Extra-virgin olive oil: 3-4 servings a day. ?Limit red meat and sweets to only a few servings a month. ?Lifestyle ? ?Cook and eat meals together with your family, when possible. ?Drink enough fluid to keep your urine pale yellow. ?Be physically active every day. This includes: ?Aerobic exercise like running or swimming. ?Leisure activities like gardening, walking, or housework. ?Get 7-8 hours of sleep each night. ?If recommended by your health care provider, drink red wine in moderation. This means 1 glass a day for nonpregnant women and 2 glasses a day for men. A glass of wine equals 5 oz (150 mL). ?What foods should I eat? ?Fruits ?Apples. Apricots. Avocado. Berries. Bananas. Cherries. Dates.   Figs. Grapes. Lemons. Melon. Oranges. Peaches. Plums. Pomegranate. ?Vegetables ?Artichokes. Beets. Broccoli. Cabbage. Carrots. Eggplant. Green beans. Chard. Kale. Spinach. Onions. Leeks. Peas. Squash. Tomatoes. Peppers. Radishes. ?Grains ?Whole-grain pasta. Brown  rice. Bulgur wheat. Polenta. Couscous. Whole-wheat bread. Oatmeal. Quinoa. ?Meats and other proteins ?Beans. Almonds. Sunflower seeds. Pine nuts. Peanuts. Cod. Salmon. Scallops. Shrimp. Tuna. Tilapia. Clams. Oysters. Eggs. Poultry without skin. ?Dairy ?Low-fat milk. Cheese. Greek yogurt. ?Fats and oils ?Extra-virgin olive oil. Avocado oil. Grapeseed oil. ?Beverages ?Water. Red wine. Herbal tea. ?Sweets and desserts ?Greek yogurt with honey. Baked apples. Poached pears. Trail mix. ?Seasonings and condiments ?Basil. Cilantro. Coriander. Cumin. Mint. Parsley. Sage. Rosemary. Tarragon. Garlic. Oregano. Thyme. Pepper. Balsamic vinegar. Tahini. Hummus. Tomato sauce. Olives. Mushrooms. ?The items listed above may not be a complete list of foods and beverages you can eat. Contact a dietitian for more information. ?What foods should I limit? ?This is a list of foods that should be eaten rarely or only on special occasions. ?Fruits ?Fruit canned in syrup. ?Vegetables ?Deep-fried potatoes (french fries). ?Grains ?Prepackaged pasta or rice dishes. Prepackaged cereal with added sugar. Prepackaged snacks with added sugar. ?Meats and other proteins ?Beef. Pork. Lamb. Poultry with skin. Hot dogs. Bacon. ?Dairy ?Ice cream. Sour cream. Whole milk. ?Fats and oils ?Butter. Canola oil. Vegetable oil. Beef fat (tallow). Lard. ?Beverages ?Juice. Sugar-sweetened soft drinks. Beer. Liquor and spirits. ?Sweets and desserts ?Cookies. Cakes. Pies. Candy. ?Seasonings and condiments ?Mayonnaise. Pre-made sauces and marinades. ?The items listed above may not be a complete list of foods and beverages you should limit. Contact a dietitian for more information. ?Summary ?The Mediterranean diet includes both food and lifestyle choices. ?Eat a variety of fresh fruits and vegetables, beans, nuts, seeds, and whole grains. ?Limit the amount of red meat and sweets that you eat. ?If recommended by your health care provider, drink red wine in moderation.  This means 1 glass a day for nonpregnant women and 2 glasses a day for men. A glass of wine equals 5 oz (150 mL). ?This information is not intended to replace advice given to you by your health care provider. Make sure you discuss any questions you have with your health care provider. ?Document Revised: 09/08/2019 Document Reviewed: 07/06/2019 ?Elsevier Patient Education ? 2023 Elsevier Inc[employer] ? ?

## 2023-03-02 NOTE — Assessment & Plan Note (Signed)
She is encouraged to aim for at least 150 minutes of exercise/week.

## 2023-03-02 NOTE — Assessment & Plan Note (Signed)
Chronic, currently on PPI therapy. She is concerned about risks associated with long-term use. She will take for full six months, and then cut back to 6 days weekly dosing. She will let me know if her sx return with this slightly reduced schedule. Reminded to stop eating 3 hrs prior to lying down.

## 2023-03-02 NOTE — Progress Notes (Signed)
Madelaine Bhat, CMA,acting as a scribe for Gwynneth Aliment, MD.,have documented all relevant documentation on the behalf of Gwynneth Aliment, MD,as directed by  Gwynneth Aliment, MD while in the presence of Gwynneth Aliment, MD.  Subjective:  Patient ID: Katherine Heath , female    DOB: [birth year] , 72 y.o.   MRN: [id number]  Chief Complaint  Patient presents with   Hyperlipidemia    HPI  Patient presents today for a cholesterol follow up. She is not on statin therapy, per her preference. She is currently taking RYR without any issues. Patient denies and chest pain, SOB, or headache. Patient has no other concerns today.  She is currently participating in a study at [facility] evaluating cognition over time. She states she has functional MRIs performed regularly.   BP Readings from Last 3 Encounters: 03/02/23 : 120/70 10/13/22 : 118/78 10/12/22 : (!) 164/102    Hyperlipidemia This is a chronic problem. The current episode started more than 1 year ago. She has no history of chronic renal disease, diabetes or hypothyroidism. Current antihyperlipidemic treatment includes diet change and exercise. The current treatment provides moderate improvement of lipids.     Past Medical History:  Diagnosis Date   Arthritis    Depression    GERD (gastroesophageal reflux disease)    Hyperlipidemia 06/17/2017   Shortness of breath 11/23/2015     Family History  Problem Relation Age of Onset   Heart disease Father    Heart attack Father    Heart disease Maternal Grandmother    Hypertension Maternal Grandmother    Heart disease Paternal Grandfather    Stroke Mother    Stroke Maternal Grandfather      Current Outpatient Medications:    Cholecalciferol (VITAMIN D-3 PO), Take by mouth daily. 6000 iu daily, Disp: , Rfl:    NON FORMULARY, DIM, Disp: , Rfl:    pantoprazole (PROTONIX) 20 MG tablet, Take 1 tablet (20 mg total) by mouth daily., Disp: 30 tablet, Rfl: 0   Red Yeast Rice Extract (RED YEAST RICE  PO), Take 1 capsule by mouth 2 (two) times daily., Disp: , Rfl:    VITAMIN K PO, Take by mouth., Disp: , Rfl:    Allergies  Allergen Reactions   Latex Itching     Review of Systems  Constitutional: Negative.   HENT: Negative.    Eyes: Negative.   Respiratory: Negative.    Cardiovascular: Negative.   Gastrointestinal: Negative.   Skin: Negative.      Today's Vitals   03/02/23 1126  BP: 120/70  Pulse: 75  Temp: 97.7 F (36.5 C)  TempSrc: Oral  Weight: 149 lb (67.6 kg)  Height: 5\' 2"  (1.575 m)  PainSc: 0-No pain   Body mass index is 27.25 kg/m.  Wt Readings from Last 3 Encounters:  03/02/23 149 lb (67.6 kg)  10/13/22 146 lb 3.2 oz (66.3 kg)  10/12/22 143 lb (64.9 kg)    The 10-year ASCVD risk score (Arnett DK, et al., 2019) is: 10.5%   Values used to calculate the score:     Age: 35 years     Sex: Female     Is Non-Hispanic African American: No     Diabetic: No     Tobacco smoker: No     Systolic Blood Pressure: 120 mmHg     Is BP treated: No     HDL Cholesterol: 55 mg/dL     Total Cholesterol: 208 mg/dL  Objective:  Physical Exam  Vitals and nursing note reviewed.  Constitutional:      Appearance: Normal appearance.  HENT:     Head: Normocephalic and atraumatic.  Eyes:     Extraocular Movements: Extraocular movements intact.  Cardiovascular:     Rate and Rhythm: Normal rate and regular rhythm.     Heart sounds: Normal heart sounds.  Pulmonary:     Effort: Pulmonary effort is normal.     Breath sounds: Normal breath sounds.  Musculoskeletal:     Cervical back: Normal range of motion.  Skin:    General: Skin is warm.  Neurological:     General: No focal deficit present.     Mental Status: She is alert.  Psychiatric:        Mood and Affect: Mood normal.        Behavior: Behavior normal.         Assessment And Plan:  Pure hypercholesterolemia Assessment & Plan: Chronic, cardiac calcium scoring reviewed. Score is ZERO. However, she does have  aortic atherosclerosis, which warrants statin therapy.  Again, she does not wish to pursue statin therapy.  She will continue with RYR and her regular exercise regimen.   Orders: -     Lipid panel -     CMP14+EGFR  Hiatal hernia Assessment & Plan: Chronic, currently on PPI therapy. She is concerned about risks associated with long-term use. She will take for full six months, and then cut back to 6 days weekly dosing. She will let me know if her sx return with this slightly reduced schedule. Reminded to stop eating 3 hrs prior to lying down.    BMI 26.0-26.9,adult Assessment & Plan: She is encouraged to aim for at least 150 minutes of exercise/week.    Drug therapy Assessment & Plan: She is on chronic PPI therapy. I will check vitamin B12 level today.   Orders: -     Vitamin B12    Return Beginning of [phone] for AWV, for 6 month chol check.  Patient was given opportunity to ask questions. Patient verbalized understanding of the plan and was able to repeat key elements of the plan. All questions were answered to their satisfaction.    I, Gwynneth Aliment, MD, have reviewed all documentation for this visit. The documentation on 03/02/23 for the exam, diagnosis, procedures, and orders are all accurate and complete.   IF YOU HAVE BEEN REFERRED TO A SPECIALIST, IT MAY TAKE 1-2 WEEKS TO SCHEDULE/PROCESS THE REFERRAL. IF YOU HAVE NOT HEARD FROM US/SPECIALIST IN TWO WEEKS, PLEASE GIVE [place] A CALL AT [phone].

## 2023-03-03 LAB — CMP14+EGFR
ALT: 19 IU/L (ref 0–32)
AST: 25 IU/L (ref 0–40)
Albumin: 4.4 g/dL (ref 3.8–4.8)
Alkaline Phosphatase: 84 IU/L (ref 44–121)
BUN/Creatinine Ratio: 25 (ref 12–28)
BUN: 20 mg/dL (ref 8–27)
Bilirubin Total: <0.2 mg/dL (ref 0.0–1.2)
CO2: 21 mmol/L (ref 20–29)
Calcium: 9.7 mg/dL (ref 8.7–10.3)
Chloride: 103 mmol/L (ref 96–106)
Creatinine, Ser: 0.8 mg/dL (ref 0.57–1.00)
Globulin, Total: 2.3 g/dL (ref 1.5–4.5)
Glucose: 96 mg/dL (ref 70–99)
Potassium: 5.2 mmol/L (ref 3.5–5.2)
Sodium: 141 mmol/L (ref 134–144)
Total Protein: 6.7 g/dL (ref 6.0–8.5)
eGFR: 78 mL/min/{1.73_m2} (ref >59)

## 2023-03-03 LAB — LIPID PANEL
Chol/HDL Ratio: 4.1 ratio (ref 0.0–4.4)
Cholesterol, Total: 240 mg/dL — ABNORMAL HIGH (ref 100–199)
HDL: 59 mg/dL (ref >39)
LDL Chol Calc (NIH): 132 mg/dL — ABNORMAL HIGH (ref 0–99)
Triglycerides: 277 mg/dL — ABNORMAL HIGH (ref 0–149)
VLDL Cholesterol Cal: 49 mg/dL — ABNORMAL HIGH (ref 5–40)

## 2023-03-03 LAB — VITAMIN B12: Vitamin B-12: 406 pg/mL (ref 232–1245)

## 2023-03-08 ENCOUNTER — Ambulatory Visit: Admission: RE | Admit: 2023-03-08 | Payer: Medicare Other | Source: Ambulatory Visit

## 2023-03-08 DIAGNOSIS — E2839 Other primary ovarian failure: Secondary | ICD-10-CM

## 2023-03-08 DIAGNOSIS — N958 Other specified menopausal and perimenopausal disorders: Secondary | ICD-10-CM | POA: Diagnosis not present

## 2023-03-08 DIAGNOSIS — E349 Endocrine disorder, unspecified: Secondary | ICD-10-CM | POA: Diagnosis not present

## 2023-03-08 DIAGNOSIS — M8588 Other specified disorders of bone density and structure, other site: Secondary | ICD-10-CM | POA: Diagnosis not present

## 2023-05-12 DIAGNOSIS — Z23 Encounter for immunization: Secondary | ICD-10-CM | POA: Diagnosis not present

## 2023-05-28 ENCOUNTER — Ambulatory Visit (INDEPENDENT_AMBULATORY_CARE_PROVIDER_SITE_OTHER): Payer: Medicare Other | Admitting: Family Medicine

## 2023-05-28 ENCOUNTER — Encounter: Payer: Self-pay | Admitting: Family Medicine

## 2023-05-28 VITALS — BP 104/70 | HR 77 | Temp 98.1°F | Ht 62.0 in | Wt 148.4 lb

## 2023-05-28 DIAGNOSIS — H1033 Unspecified acute conjunctivitis, bilateral: Secondary | ICD-10-CM | POA: Diagnosis not present

## 2023-05-28 DIAGNOSIS — H5789 Other specified disorders of eye and adnexa: Secondary | ICD-10-CM

## 2023-05-28 MED ORDER — AZITHROMYCIN 1 % OP SOLN: 1.0000 [drp] | Freq: Every day | OPHTHALMIC | 0 refills | Status: DC

## 2023-05-28 NOTE — Patient Instructions (Signed)
Bacterial Conjunctivitis, Adult  Bacterial conjunctivitis is an infection of your conjunctiva. This is the clear membrane that covers the white part of your eye and the inner part of your eyelid. This infection can make your eye:  Red or pink.  Itchy or irritated.  This condition spreads easily from person to person (is contagious) and from one eye to the other eye.  What are the causes?  This condition is caused by germs (bacteria). You may get the infection if you come into close contact with:  A person who has the infection.  Items that have germs on them (are contaminated), such as face towels, contact lens solution, or eye makeup.  What increases the risk?  You are more likely to get this condition if:  You have contact with people who have the infection.  You wear contact lenses.  You have a sinus infection.  You have had a recent eye injury or surgery.  You have a weak body defense system (immune system).  You have dry eyes.  What are the signs or symptoms?    Thick, yellowish discharge from the eye.  Tearing or watery eyes.  Itchy eyes.  Burning feeling in your eyes.  Eye redness.  Swollen eyelids.  Blurred vision.  How is this treated?    Antibiotic eye drops or ointment.  Antibiotic medicine taken by mouth. This is used for infections that do not get better with drops or ointment or that last more than 10 days.  Cool, wet cloths placed on the eyes.  Artificial tears used 2-6 times a day.  Follow these instructions at home:  Medicines  Take or apply your antibiotic medicine as told by your doctor. Do not stop using it even if you start to feel better.  Take or apply over-the-counter and prescription medicines only as told by your doctor.  Do not touch your eyelid with the eye-drop bottle or the ointment tube.  Managing discomfort  Wipe any fluid from your eye with a warm, wet washcloth or a cotton ball.  Place a clean, cool, wet cloth on your eye. Do this for 10-20 minutes, 3-4 times a day.  General  instructions  Do not wear contacts until the infection is gone. Wear glasses until your doctor says it is okay to wear contacts again.  Do not wear eye makeup until the infection is gone. Throw away old eye makeup.  Change or wash your pillowcase every day.  Do not share towels or washcloths.  Wash your hands often with soap and water for at least 20 seconds and especially before touching your face or eyes. Use paper towels to dry your hands.  Do not touch or rub your eyes.  Do not drive or use heavy machinery if your vision is blurred.  Contact a doctor if:  You have a fever.  You do not get better after 10 days.  Get help right away if:  You have a fever and your symptoms get worse all of a sudden.  You have very bad pain when you move your eye.  Your face:  Hurts.  Is red.  Is swollen.  You have sudden loss of vision.  Summary  Bacterial conjunctivitis is an infection of your conjunctiva.  This infection spreads easily from person to person.  Wash your hands often with soap and water for at least 20 seconds and especially before touching your face or eyes. Use paper towels to dry your hands.  Take or apply your  antibiotic medicine as told by your doctor.  Contact a doctor if you have a fever or you do not get better after 10 days.  This information is not intended to replace advice given to you by your health care provider. Make sure you discuss any questions you have with your health care provider.  Document Revised: 11/13/2020 Document Reviewed: 11/13/2020  Elsevier Patient Education  2024 [employer].

## 2023-05-28 NOTE — Progress Notes (Signed)
I,Victoria T Deloria Lair, CMA,acting as a Neurosurgeon for [employer], NP.,have documented all relevant documentation on the behalf of Ellender Hose, NP,as directed by  Ellender Hose, NP while in the presence of Ellender Hose, NP.  Subjective:  Patient ID: Katherine Heath , female    DOB: [birth year] , 72 y.o.   MRN: [id number]  Chief Complaint  Patient presents with   Conjunctivitis    HPI  Patient presents today for  pink eye. Patient reports it started  on Wednesday in the left eye that has now progressed into the right eye. She states it started with a redness, burning sensation with irritation. She denies visual loss, discharge or sensitivity to light. Also she denies fever, headache, runny nose.        Past Medical History:  Diagnosis Date   Arthritis    Depression    GERD (gastroesophageal reflux disease)    Hyperlipidemia 06/17/2017   Shortness of breath 11/23/2015     Family History  Problem Relation Age of Onset   Heart disease Father    Heart attack Father    Heart disease Maternal Grandmother    Hypertension Maternal Grandmother    Heart disease Paternal Grandfather    Stroke Mother    Stroke Maternal Grandfather      Current Outpatient Medications:    azithromycin (AZASITE) 1 % ophthalmic solution, Place 1 drop into both eyes daily., Disp: 2.5 mL, Rfl: 0   Cholecalciferol (VITAMIN D-3 PO), Take by mouth daily. 6000 iu daily, Disp: , Rfl:    NON FORMULARY, DIM, Disp: , Rfl:    Red Yeast Rice Extract (RED YEAST RICE PO), Take 1 capsule by mouth 2 (two) times daily., Disp: , Rfl:    VITAMIN K PO, Take by mouth., Disp: , Rfl:    pantoprazole (PROTONIX) 20 MG tablet, Take 1 tablet (20 mg total) by mouth daily., Disp: 30 tablet, Rfl: 0   Allergies  Allergen Reactions   Latex Itching     Review of Systems  Constitutional: Negative.   Eyes:  Positive for pain, redness and itching. Negative for visual disturbance.  Respiratory: Negative.    Cardiovascular: Negative.    Neurological: Negative.   Psychiatric/Behavioral: Negative.       Today's Vitals   05/28/23 1030  BP: 104/70  Pulse: 77  Temp: 98.1 F (36.7 C)  SpO2: 98%  Weight: 148 lb 6.4 oz (67.3 kg)  Height: 5\' 2"  (1.575 m)   Body mass index is 27.14 kg/m.  Wt Readings from Last 3 Encounters:  05/28/23 148 lb 6.4 oz (67.3 kg)  03/02/23 149 lb (67.6 kg)  10/13/22 146 lb 3.2 oz (66.3 kg)     Objective:  Physical Exam Constitutional:      Appearance: Normal appearance.  Eyes:     General:        Right eye: No discharge.        Left eye: No discharge.     Conjunctiva/sclera:     Right eye: No exudate or hemorrhage.    Left eye: No exudate or hemorrhage. Cardiovascular:     Rate and Rhythm: Normal rate and regular rhythm.     Pulses: Normal pulses.     Heart sounds: Normal heart sounds.  Pulmonary:     Effort: Pulmonary effort is normal.     Breath sounds: Normal breath sounds.  Abdominal:     General: Bowel sounds are normal.  Musculoskeletal:     Cervical back: Normal range of motion.  Neurological:     Mental Status: She is alert.         Assessment And Plan:  Redness of both eyes  Acute bacterial conjunctivitis of both eyes  Other orders -     Azithromycin; Place 1 drop into both eyes daily.  Dispense: 2.5 mL; Refill: 0     Return if symptoms worsen or fail to improve.  Patient was given opportunity to ask questions. Patient verbalized understanding of the plan and was able to repeat key elements of the plan. All questions were answered to their satisfaction.  Ellender Hose, NP  I, Ellender Hose, NP, have reviewed all documentation for this visit. The documentation on 06/07/23 for the exam, diagnosis, procedures, and orders are all accurate and complete.   IF YOU HAVE BEEN REFERRED TO A SPECIALIST, IT MAY TAKE 1-2 WEEKS TO SCHEDULE/PROCESS THE REFERRAL. IF YOU HAVE NOT HEARD FROM US/SPECIALIST IN TWO WEEKS, PLEASE GIVE [place] A CALL AT [phone] X [phone].   THE  PATIENT IS ENCOURAGED TO PRACTICE SOCIAL DISTANCING DUE TO THE COVID-19 PANDEMIC.

## 2023-06-01 DIAGNOSIS — Z23 Encounter for immunization: Secondary | ICD-10-CM | POA: Diagnosis not present

## 2023-06-07 ENCOUNTER — Encounter: Payer: Self-pay | Admitting: Family Medicine

## 2023-06-07 DIAGNOSIS — H1033 Unspecified acute conjunctivitis, bilateral: Secondary | ICD-10-CM | POA: Insufficient documentation

## 2023-07-01 DIAGNOSIS — H25041 Posterior subcapsular polar age-related cataract, right eye: Secondary | ICD-10-CM | POA: Diagnosis not present

## 2023-07-01 DIAGNOSIS — H43813 Vitreous degeneration, bilateral: Secondary | ICD-10-CM | POA: Diagnosis not present

## 2023-07-01 DIAGNOSIS — H2511 Age-related nuclear cataract, right eye: Secondary | ICD-10-CM | POA: Diagnosis not present

## 2023-07-01 DIAGNOSIS — H25011 Cortical age-related cataract, right eye: Secondary | ICD-10-CM | POA: Diagnosis not present

## 2023-07-13 ENCOUNTER — Other Ambulatory Visit: Payer: Self-pay | Admitting: Internal Medicine

## 2023-07-13 DIAGNOSIS — Z1231 Encounter for screening mammogram for malignant neoplasm of breast: Secondary | ICD-10-CM

## 2023-07-21 ENCOUNTER — Ambulatory Visit: Payer: Medicare Other

## 2023-07-21 VITALS — BP 128/60 | HR 72 | Temp 98.0°F | Ht 62.4 in | Wt 149.6 lb

## 2023-07-21 DIAGNOSIS — Z0000 Encounter for general adult medical examination without abnormal findings: Secondary | ICD-10-CM | POA: Diagnosis not present

## 2023-07-21 NOTE — Progress Notes (Signed)
Subjective:   Katherine Heath is a 72 y.o. female who presents for Medicare Annual (Subsequent) preventive examination.  Visit Complete: In person    Cardiac Risk Factors include: advanced age (>2men, >33 women);dyslipidemia     Objective:    Today's Vitals   07/21/23 1131  BP: 128/60  Pulse: 72  Temp: 98 F (36.7 C)  TempSrc: Oral  SpO2: 92%  Weight: 149 lb 9.6 oz (67.9 kg)  Height: 5' 2.4" (1.585 m)   Body mass index is 27.01 kg/m.     07/21/2023   11:38 AM 10/12/2022   11:58 AM 07/16/2022    2:07 PM 06/04/2021   10:44 AM 06/23/2020    7:31 AM 05/29/2020    9:17 AM 05/16/2019    8:45 AM  Advanced Directives  Does Patient Have a Medical Advance Directive? Yes No Yes Yes Yes Yes Yes  Type of Estate agent of [place];Living will  Healthcare Power of [place];Living will Healthcare Power of [place];Living will Healthcare Power of [place];Living will Healthcare Power of [place];Living will Healthcare Power of [place];Living will  Copy of Healthcare Power of Attorney in Chart? No - copy requested  No - copy requested No - copy requested  No - copy requested No - copy requested  Would patient like information on creating a medical advance directive?  No - Patient declined         Current Medications (verified) Outpatient Encounter Medications as of 07/21/2023  Medication Sig   b complex vitamins capsule Take 1 capsule by mouth daily.   Cholecalciferol (VITAMIN D-3 PO) Take by mouth daily. 6000 iu daily   Red Yeast Rice Extract (RED YEAST RICE PO) Take 1 capsule by mouth 2 (two) times daily.   VITAMIN K PO Take by mouth.   azithromycin (AZASITE) 1 % ophthalmic solution Place 1 drop into both eyes daily. (Patient not taking: Reported on 07/21/2023)   NON FORMULARY DIM (Patient not taking: Reported on 07/21/2023)   pantoprazole (PROTONIX) 20 MG tablet Take 1 tablet (20 mg total) by mouth daily.   No facility-administered encounter medications on file  as of 07/21/2023.    Allergies (verified) Latex   History: Past Medical History:  Diagnosis Date   Arthritis    Depression    GERD (gastroesophageal reflux disease)    Hyperlipidemia 06/17/2017   Shortness of breath 11/23/2015   Past Surgical History:  Procedure Laterality Date   sphincterotomy  08/17/2001   WRIST SURGERY Left 06/2020   Family History  Problem Relation Age of Onset   Heart disease Father    Heart attack Father    Heart disease Maternal Grandmother    Hypertension Maternal Grandmother    Heart disease Paternal Grandfather    Stroke Mother    Stroke Maternal Grandfather    Social History   Socioeconomic History   Marital status: Married    Spouse name: Not on file   Number of children: Not on file   Years of education: Not on file   Highest education level: Not on file  Occupational History   Occupation: retired  Tobacco Use   Smoking status: Never   Smokeless tobacco: Never  Vaping Use   Vaping status: Never Used  Substance and Sexual Activity   Alcohol use: Yes    Comment: Occ   Drug use: No   Sexual activity: Not Currently  Other Topics Concern   Not on file  Social History Narrative   Not on file   Social  Determinants of Health   Financial Resource Strain: Low Risk  (07/21/2023)   Overall Financial Resource Strain (CARDIA)    Difficulty of Paying Living Expenses: Not hard at all  Food Insecurity: No Food Insecurity (07/21/2023)   Hunger Vital Sign    Worried About Running Out of Food in the Last Year: Never true    Ran Out of Food in the Last Year: Never true  Transportation Needs: No Transportation Needs (07/21/2023)   PRAPARE - Administrator, Civil Service (Medical): No    Lack of Transportation (Non-Medical): No  Physical Activity: Sufficiently Active (07/21/2023)   Exercise Vital Sign    Days of Exercise per Week: 7 days    Minutes of Exercise per Session: 60 min  Stress: Stress Concern Present (07/21/2023)   [employer] of Occupational Health - Occupational Stress Questionnaire    Feeling of Stress : To some extent  Social Connections: Moderately Isolated (07/21/2023)   Social Connection and Isolation Panel [NHANES]    Frequency of Communication with Friends and Family: More than three times a week    Frequency of Social Gatherings with Friends and Family: Not on file    Attends Religious Services: Never    Database administrator or Organizations: No    Attends Engineer, structural: Never    Marital Status: Married    Tobacco Counseling Counseling given: Not Answered   Clinical Intake:  Pre-visit preparation completed: Yes  Pain : No/denies pain     Nutritional Status: BMI 25 -29 Overweight Nutritional Risks: None Diabetes: No  How often do you need to have someone help you when you read instructions, pamphlets, or other written materials from your doctor or pharmacy?: 1 - Never  Interpreter Needed?: No  Information entered by :: NAllen LPN   Activities of Daily Living    07/21/2023   11:32 AM  In your present state of health, do you have any difficulty performing the following activities:  Hearing? 0  Vision? 0  Difficulty concentrating or making decisions? 1  Comment memory sometimes  Walking or climbing stairs? 0  Dressing or bathing? 0  Doing errands, shopping? 0  Preparing Food and eating ? N  Using the Toilet? N  In the past six months, have you accidently leaked urine? Y  Do you have problems with loss of bowel control? N  Managing your Medications? N  Managing your Finances? N  Housekeeping or managing your Housekeeping? N    Patient Care Team: Dorothyann Peng, MD as PCP - General (Internal Medicine)  Indicate any recent Medical Services you may have received from other than [facility] providers in the past year (date may be approximate).     Assessment:   This is a routine wellness examination for Katherine Heath.  Hearing/Vision screen Hearing Screening -  Comments:: Denies hearing issues Vision Screening - Comments:: Regular eye exams, [facility]   Goals Addressed             This Visit's Progress    Patient Stated       Patient Stated       07/21/2023, wants to lose weight       Depression Screen    07/21/2023   11:40 AM 05/28/2023   10:32 AM 10/13/2022    9:48 AM 07/16/2022    2:09 PM 06/04/2021   10:47 AM 11/27/2020   11:33 AM 05/29/2020    9:19 AM  PHQ 2/9 Scores  PHQ - 2  Score 1 0 0 0 0 0 1  PHQ- 9 Score 3 0    7 11    Fall Risk    07/21/2023   11:39 AM 05/28/2023   10:32 AM 10/13/2022    9:48 AM 09/01/2022   11:48 AM 07/16/2022    2:08 PM  Fall Risk   Falls in the past year? 0 0 0 0 1  Comment     tripped  Number falls in past yr: 0 0 0 0 1  Injury with Fall? 0 0 0 0 0  Risk for fall due to : No Fall Risks No Fall Risks No Fall Risks No Fall Risks History of fall(s)  Follow up Falls prevention discussed;Falls evaluation completed Falls evaluation completed Falls evaluation completed Falls evaluation completed Falls evaluation completed;Education provided;Falls prevention discussed    MEDICARE RISK AT HOME: Medicare Risk at Home Any stairs in or around the home?: Yes If so, are there any without handrails?: No Home free of loose throw rugs in walkways, pet beds, electrical cords, etc?: Yes Adequate lighting in your home to reduce risk of falls?: Yes Life alert?: No Use of a cane, walker or w/c?: No Grab bars in the bathroom?: Yes Shower chair or bench in shower?: Yes Elevated toilet seat or a handicapped toilet?: No  TIMED UP AND GO:  Was the test performed?  Yes  Length of time to ambulate 10 feet: 5 sec Gait steady and fast without use of assistive device    Cognitive Function:        07/21/2023   11:41 AM 07/16/2022    2:10 PM 06/04/2021   10:49 AM 05/29/2020    9:27 AM 05/16/2019    8:58 AM  6CIT Screen  What Year? 0 points 0 points 0 points 0 points 0 points  What month? 0 points  0 points 0 points 0 points 0 points  What time? 0 points 0 points 0 points 0 points 0 points  Count back from 20 0 points 0 points 0 points 0 points 0 points  Months in reverse 0 points 0 points 0 points 0 points 0 points  Repeat phrase 0 points 0 points 0 points 0 points 0 points  Total Score 0 points 0 points 0 points 0 points 0 points    Immunizations Immunization History  Administered Date(s) Administered   Fluad Quad(high Dose 65+) 06/01/2023   Influenza, High Dose Seasonal PF 05/26/2018, 05/31/2021   Influenza-Unspecified 06/15/2022   Moderna SARS-COV2 Booster Vaccination 12/27/2021   PFIZER Comirnaty(Gray Top)Covid-19 Tri-Sucrose Vaccine 05/18/2022   PFIZER(Purple Top)SARS-COV-2 Vaccination 09/22/2019, 10/13/2019, 06/07/2020, 11/16/2020, 04/30/2021   Pneumococcal Conjugate-13 06/06/2018   Pneumococcal Polysaccharide-23 09/01/2022   Tdap 12/14/2018   Unspecified SARS-COV-2 Vaccination 05/12/2023   Zoster Recombinant(Shingrix) 03/07/2020, 05/16/2020    TDAP status: Up to date  Flu Vaccine status: Up to date  Pneumococcal vaccine status: Up to date  Covid-19 vaccine status: Completed vaccines  Qualifies for Shingles Vaccine? Yes   Zostavax completed Yes   Shingrix Completed?: Yes  Screening Tests Health Maintenance  Topic Date Due   COVID-19 Vaccine (8 - 2023-24 season) 07/07/2023   Medicare Annual Wellness (AWV)  07/20/2024   MAMMOGRAM  08/18/2024   Colonoscopy  01/02/2026   DTaP/Tdap/Td (2 - Td or Tdap) 12/13/2028   Pneumonia Vaccine 39+ Years old  Completed   INFLUENZA VACCINE  Completed   DEXA SCAN  Completed   Hepatitis C Screening  Completed   Zoster Vaccines- Shingrix  Completed  HPV VACCINES  Aged Out    Health Maintenance  Health Maintenance Due  Topic Date Due   COVID-19 Vaccine (8 - 2023-24 season) 07/07/2023    Colorectal cancer screening: Type of screening: Colonoscopy. Completed 01/03/2016. Repeat every 10 years  Mammogram status:  scheduled for 08/20/2023  Bone Density status: Completed 03/08/2023.   Lung Cancer Screening: (Low Dose CT Chest recommended if Age 28-80 years, 20 pack-year currently smoking OR have quit w/in 15years.) does not qualify.   Lung Cancer Screening Referral: no  Additional Screening:  Hepatitis C Screening: does qualify; Completed 03/01/2018  Vision Screening: Recommended annual ophthalmology exams for early detection of glaucoma and other disorders of the eye. Is the patient up to date with their annual eye exam?  Yes  Who is the provider or what is the name of the office in which the patient attends annual eye exams? [facility] If pt is not established with a provider, would they like to be referred to a provider to establish care? No .   Dental Screening: Recommended annual dental exams for proper oral hygiene  Diabetic Foot Exam: n/a  Community Resource Referral / Chronic Care Management: CRR required this visit?  No   CCM required this visit?  No     Plan:     I have personally reviewed and noted the following in the patient's chart:   Medical and social history Use of alcohol, tobacco or illicit drugs  Current medications and supplements including opioid prescriptions. Patient is not currently taking opioid prescriptions. Functional ability and status Nutritional status Physical activity Advanced directives List of other physicians Hospitalizations, surgeries, and ER visits in previous 12 months Vitals Screenings to include cognitive, depression, and falls Referrals and appointments  In addition, I have reviewed and discussed with patient certain preventive protocols, quality metrics, and best practice recommendations. A written personalized care plan for preventive services as well as general preventive health recommendations were provided to patient.     Barb Merino, LPN   [id number]/[id number]   After Visit Summary: (In Person-Printed) AVS printed and given to  the patient  Nurse Notes: none

## 2023-07-21 NOTE — Patient Instructions (Addendum)
Ms. Tetreault , Thank you for taking time to come for your Medicare Wellness Visit. I appreciate your ongoing commitment to your health goals. Please review the following plan we discussed and let me know if I can assist you in the future.   Referrals/Orders/Follow-Ups/Clinician Recommendations: none  This is a list of the screening recommended for you and due dates:  Health Maintenance  Topic Date Due   COVID-19 Vaccine (8 - 2023-24 season) 07/07/2023   Medicare Annual Wellness Visit  07/20/2024   Mammogram  08/18/2024   Colon Cancer Screening  01/02/2026   DTaP/Tdap/Td vaccine (2 - Td or Tdap) 12/13/2028   Pneumonia Vaccine  Completed   Flu Shot  Completed   DEXA scan (bone density measurement)  Completed   Hepatitis C Screening  Completed   Zoster (Shingles) Vaccine  Completed   HPV Vaccine  Aged Out    Advanced directives: (Copy Requested) Please bring a copy of your health care power of attorney and living will to the office to be added to your chart at your convenience.  Next Medicare Annual Wellness Visit scheduled for next year: No, office will schedule  Insert Preventive Care attachment Insert FALL PREVENTION attachment if needed

## 2023-08-20 ENCOUNTER — Ambulatory Visit
Admission: RE | Admit: 2023-08-20 | Discharge: 2023-08-20 | Disposition: A | Discharge status: Home / Self Care | Payer: Medicare Other | Source: Ambulatory Visit | Attending: Internal Medicine | Admitting: Internal Medicine

## 2023-08-20 DIAGNOSIS — Z1231 Encounter for screening mammogram for malignant neoplasm of breast: Secondary | ICD-10-CM | POA: Diagnosis not present

## 2023-09-07 ENCOUNTER — Ambulatory Visit: Payer: Medicare Other | Admitting: Internal Medicine

## 2023-09-07 ENCOUNTER — Encounter: Payer: Self-pay | Admitting: Internal Medicine

## 2023-09-07 VITALS — BP 118/80 | HR 64 | Temp 98.1°F | Ht 62.0 in | Wt 148.2 lb

## 2023-09-07 DIAGNOSIS — I700 Atherosclerosis of aorta: Secondary | ICD-10-CM | POA: Diagnosis not present

## 2023-09-07 DIAGNOSIS — E7800 Pure hypercholesterolemia, unspecified: Secondary | ICD-10-CM | POA: Diagnosis not present

## 2023-09-07 DIAGNOSIS — N3946 Mixed incontinence: Secondary | ICD-10-CM

## 2023-09-07 LAB — POCT URINALYSIS DIPSTICK
Bilirubin, UA: NEGATIVE
Blood, UA: NEGATIVE
Glucose, UA: NEGATIVE
Ketones, UA: NEGATIVE
Leukocytes, UA: NEGATIVE
Nitrite, UA: NEGATIVE
Protein, UA: NEGATIVE
Spec Grav, UA: 1.01 (ref 1.010–1.025)
Urobilinogen, UA: 0.2 U/dL
pH, UA: 6 (ref 5.0–8.0)

## 2023-09-07 NOTE — Assessment & Plan Note (Signed)
Chronic, LDL goal is less than 70. She does not wish to take statin therapy.  She is not fasting, agrees to rto within a week for labwork. Encouraged to follow heart healthy lifestyle.

## 2023-09-07 NOTE — Assessment & Plan Note (Signed)
Chronic, LDL goal is less than 70.  She does not wish to take statin therapy. She is on RYR per her preference.

## 2023-09-07 NOTE — Assessment & Plan Note (Signed)
U/a performed, neg for UTI. I offered to start Myrbetriq, she declines. She does agree to PT for pelvic floor strengthening.

## 2023-09-07 NOTE — Progress Notes (Signed)
I,[name], CMA,acting as a Neurosurgeon for Gwynneth Aliment, MD.,have documented all relevant documentation on the behalf of Gwynneth Aliment, MD,as directed by  Gwynneth Aliment, MD while in the presence of Gwynneth Aliment, MD.  Subjective:  Patient ID: Katherine Heath , female    DOB: 12-Dec-1950 , 73 y.o.   MRN: [id number]  Chief Complaint  Patient presents with   Hyperlipidemia    HPI  Patient presents today for a cholesterol follow up. She is not on statin therapy, per her preference. She is currently taking RYR without any issues. Patient denies having any chest pain, SOB, and headache.  She complains of leaking urine. She does not remember when this initially started. She admits this issue is getting worse. Denies smell or cloudy urine.   Hyperlipidemia This is a chronic problem. The current episode started more than 1 year ago. She has no history of chronic renal disease, diabetes or hypothyroidism. Current antihyperlipidemic treatment includes diet change and exercise. The current treatment provides moderate improvement of lipids.     Past Medical History:  Diagnosis Date   Arthritis    Depression    GERD (gastroesophageal reflux disease)    Hyperlipidemia 06/17/2017   Shortness of breath 11/23/2015     Family History  Problem Relation Age of Onset   Heart disease Father    Heart attack Father    Heart disease Maternal Grandmother    Hypertension Maternal Grandmother    Heart disease Paternal Grandfather    Stroke Mother    Stroke Maternal Grandfather      Current Outpatient Medications:    b complex vitamins capsule, Take 1 capsule by mouth daily., Disp: , Rfl:    Cholecalciferol (VITAMIN D-3 PO), Take by mouth daily. 6000 iu daily, Disp: , Rfl:    Red Yeast Rice Extract (RED YEAST RICE PO), Take 1 capsule by mouth 2 (two) times daily., Disp: , Rfl:    VITAMIN K PO, Take by mouth., Disp: , Rfl:    azithromycin (AZASITE) 1 % ophthalmic solution, Place 1 drop into  both eyes daily. (Patient not taking: Reported on 09/07/2023), Disp: 2.5 mL, Rfl: 0   NON FORMULARY, DIM (Patient not taking: Reported on 09/07/2023), Disp: , Rfl:    pantoprazole (PROTONIX) 20 MG tablet, Take 1 tablet (20 mg total) by mouth daily., Disp: 30 tablet, Rfl: 0   Allergies  Allergen Reactions   Latex Itching     Review of Systems  Constitutional: Negative.   Respiratory: Negative.    Cardiovascular: Negative.   Gastrointestinal: Negative.   Neurological: Negative.   Psychiatric/Behavioral: Negative.       Today's Vitals   09/07/23 1140  BP: 118/80  Pulse: 64  Temp: 98.1 F (36.7 C)  SpO2: 98%  Weight: 148 lb 3.2 oz (67.2 kg)  Height: 5\' 2"  (1.575 m)   Body mass index is 27.11 kg/m.  Wt Readings from Last 3 Encounters:  09/07/23 148 lb 3.2 oz (67.2 kg)  07/21/23 149 lb 9.6 oz (67.9 kg)  05/28/23 148 lb 6.4 oz (67.3 kg)    The 10-year ASCVD risk score (Arnett DK, et al., 2019) is: 10.4%   Values used to calculate the score:     Age: 77 years     Sex: Female     Is Non-Hispanic African American: No     Diabetic: No     Tobacco smoker: No     Systolic Blood Pressure: 118 mmHg  Is BP treated: No     HDL Cholesterol: 59 mg/dL     Total Cholesterol: 240 mg/dL  Objective:  Physical Exam Vitals and nursing note reviewed.  Constitutional:      Appearance: Normal appearance.  HENT:     Head: Normocephalic and atraumatic.  Eyes:     Extraocular Movements: Extraocular movements intact.  Cardiovascular:     Rate and Rhythm: Normal rate and regular rhythm.     Heart sounds: Normal heart sounds.  Pulmonary:     Effort: Pulmonary effort is normal.     Breath sounds: Normal breath sounds.  Skin:    General: Skin is warm.  Neurological:     General: No focal deficit present.     Mental Status: She is alert.  Psychiatric:        Mood and Affect: Mood normal.        Behavior: Behavior normal.         Assessment And Plan:  Pure  hypercholesterolemia Assessment & Plan: Chronic, LDL goal is less than 70. She does not wish to take statin therapy.  She is not fasting, agrees to rto within a week for labwork. Encouraged to follow heart healthy lifestyle.   Orders: -     Lipid panel; Future -     CMP14+EGFR; Future -     CBC  Aortic atherosclerosis (HCC) Assessment & Plan: Chronic, LDL goal is less than 70.  She does not wish to take statin therapy. She is on RYR per her preference.    Mixed stress and urge incontinence Assessment & Plan: U/a performed, neg for UTI. I offered to start Myrbetriq, she declines. She does agree to PT for pelvic floor strengthening.   Orders: -     POCT urinalysis dipstick -     Ambulatory referral to Physical Therapy    Return in 1 week (on 09/14/2023), or lab visit, for 6 month chol f/u.Marland Kitchen  Patient was given opportunity to ask questions. Patient verbalized understanding of the plan and was able to repeat key elements of the plan. All questions were answered to their satisfaction.    I, Gwynneth Aliment, MD, have reviewed all documentation for this visit. The documentation on 09/07/23 for the exam, diagnosis, procedures, and orders are all accurate and complete.   IF YOU HAVE BEEN REFERRED TO A SPECIALIST, IT MAY TAKE 1-2 WEEKS TO SCHEDULE/PROCESS THE REFERRAL. IF YOU HAVE NOT HEARD FROM US/SPECIALIST IN TWO WEEKS, PLEASE GIVE [place] A CALL AT [phone].

## 2023-09-07 NOTE — Patient Instructions (Signed)
Cholesterol Content in Foods Cholesterol is a waxy, fat-like substance that helps to carry fat in the blood. The body needs cholesterol in small amounts, but too much cholesterol can cause damage to the arteries and heart. What foods have cholesterol?  Cholesterol is found in animal-based foods, such as meat, seafood, and dairy. Generally, low-fat dairy and lean meats have less cholesterol than full-fat dairy and fatty meats. The milligrams of cholesterol per serving (mg per serving) of common cholesterol-containing foods are listed below. Meats and other proteins Egg -- one large whole egg has 186 mg. Veal shank -- 4 oz (113 g) has 141 mg. Lean ground turkey (93% lean) -- 4 oz (113 g) has 118 mg. Fat-trimmed lamb loin -- 4 oz (113 g) has 106 mg. Lean ground beef (90% lean) -- 4 oz (113 g) has 100 mg. Lobster -- 3.5 oz (99 g) has 90 mg. Pork loin chops -- 4 oz (113 g) has 86 mg. Canned salmon -- 3.5 oz (99 g) has 83 mg. Fat-trimmed beef top loin -- 4 oz (113 g) has 78 mg. Frankfurter -- 1 frank (3.5 oz or 99 g) has 77 mg. Crab -- 3.5 oz (99 g) has 71 mg. Roasted chicken without skin, white meat -- 4 oz (113 g) has 66 mg. Light bologna -- 2 oz (57 g) has 45 mg. Deli-cut turkey -- 2 oz (57 g) has 31 mg. Canned tuna -- 3.5 oz (99 g) has 31 mg. Bacon -- 1 oz (28 g) has 29 mg. Oysters and mussels (raw) -- 3.5 oz (99 g) has 25 mg. Mackerel -- 1 oz (28 g) has 22 mg. Trout -- 1 oz (28 g) has 20 mg. Pork sausage -- 1 link (1 oz or 28 g) has 17 mg. Salmon -- 1 oz (28 g) has 16 mg. Tilapia -- 1 oz (28 g) has 14 mg. Dairy Soft-serve ice cream --  cup (4 oz or 86 g) has 103 mg. Whole-milk yogurt -- 1 cup (8 oz or 245 g) has 29 mg. Cheddar cheese -- 1 oz (28 g) has 28 mg. American cheese -- 1 oz (28 g) has 28 mg. Whole milk -- 1 cup (8 oz or 250 mL) has 23 mg. 2% milk -- 1 cup (8 oz or 250 mL) has 18 mg. Cream cheese -- 1 tablespoon (Tbsp) (14.5 g) has 15 mg. Cottage cheese --  cup (4 oz or  113 g) has 14 mg. Low-fat (1%) milk -- 1 cup (8 oz or 250 mL) has 10 mg. Sour cream -- 1 Tbsp (12 g) has 8.5 mg. Low-fat yogurt -- 1 cup (8 oz or 245 g) has 8 mg. Nonfat Greek yogurt -- 1 cup (8 oz or 228 g) has 7 mg. Half-and-half cream -- 1 Tbsp (15 mL) has 5 mg. Fats and oils Cod liver oil -- 1 tablespoon (Tbsp) (13.6 g) has 82 mg. Butter -- 1 Tbsp (14 g) has 15 mg. Lard -- 1 Tbsp (12.8 g) has 14 mg. Bacon grease -- 1 Tbsp (12.9 g) has 14 mg. Mayonnaise -- 1 Tbsp (13.8 g) has 5-10 mg. Margarine -- 1 Tbsp (14 g) has 3-10 mg. The items listed above may not be a complete list of foods with cholesterol. Exact amounts of cholesterol in these foods may vary depending on specific ingredients and brands. Contact a dietitian for more information. What foods do not have cholesterol? Most plant-based foods do not have cholesterol unless you combine them with a food that has   cholesterol. Foods without cholesterol include: Grains and cereals. Vegetables. Fruits. Vegetable oils, such as olive, canola, and sunflower oil. Legumes, such as peas, beans, and lentils. Nuts and seeds. Egg whites. The items listed above may not be a complete list of foods that do not have cholesterol. Contact a dietitian for more information. Summary The body needs cholesterol in small amounts, but too much cholesterol can cause damage to the arteries and heart. Cholesterol is found in animal-based foods, such as meat, seafood, and dairy. Generally, low-fat dairy and lean meats have less cholesterol than full-fat dairy and fatty meats. This information is not intended to replace advice given to you by your health care provider. Make sure you discuss any questions you have with your health care provider. Document Revised: 12/13/2020 Document Reviewed: 12/13/2020 Elsevier Patient Education  2024 [employer]  

## 2023-09-13 ENCOUNTER — Other Ambulatory Visit: Payer: Medicare Other

## 2023-09-13 DIAGNOSIS — E7800 Pure hypercholesterolemia, unspecified: Secondary | ICD-10-CM | POA: Diagnosis not present

## 2023-09-13 LAB — CMP14+EGFR
ALT: 16 [IU]/L (ref 0–32)
AST: 23 [IU]/L (ref 0–40)
Albumin: 4.2 g/dL (ref 3.8–4.8)
Alkaline Phosphatase: 92 [IU]/L (ref 44–121)
BUN/Creatinine Ratio: 18 (ref 12–28)
BUN: 16 mg/dL (ref 8–27)
Bilirubin Total: 0.6 mg/dL (ref 0.0–1.2)
CO2: 24 mmol/L (ref 20–29)
Calcium: 9.5 mg/dL (ref 8.7–10.3)
Chloride: 100 mmol/L (ref 96–106)
Creatinine, Ser: 0.89 mg/dL (ref 0.57–1.00)
Globulin, Total: 2.1 g/dL (ref 1.5–4.5)
Glucose: 85 mg/dL (ref 70–99)
Potassium: 4.6 mmol/L (ref 3.5–5.2)
Sodium: 139 mmol/L (ref 134–144)
Total Protein: 6.3 g/dL (ref 6.0–8.5)
eGFR: 69 mL/min/{1.73_m2} (ref >59)

## 2023-09-13 LAB — LIPID PANEL
Chol/HDL Ratio: 3.2 {ratio} (ref 0.0–4.4)
Cholesterol, Total: 189 mg/dL (ref 100–199)
HDL: 60 mg/dL (ref >39)
LDL Chol Calc (NIH): 108 mg/dL — ABNORMAL HIGH (ref 0–99)
Triglycerides: 118 mg/dL (ref 0–149)
VLDL Cholesterol Cal: 21 mg/dL (ref 5–40)

## 2023-09-23 ENCOUNTER — Encounter: Payer: Self-pay | Admitting: Physical Therapy

## 2023-09-23 ENCOUNTER — Ambulatory Visit: Payer: Medicare Other | Attending: Internal Medicine | Admitting: Physical Therapy

## 2023-09-23 ENCOUNTER — Other Ambulatory Visit: Payer: Self-pay

## 2023-09-23 DIAGNOSIS — R293 Abnormal posture: Secondary | ICD-10-CM | POA: Diagnosis not present

## 2023-09-23 DIAGNOSIS — N3946 Mixed incontinence: Secondary | ICD-10-CM | POA: Insufficient documentation

## 2023-09-23 DIAGNOSIS — R279 Unspecified lack of coordination: Secondary | ICD-10-CM | POA: Insufficient documentation

## 2023-09-23 DIAGNOSIS — M6281 Muscle weakness (generalized): Secondary | ICD-10-CM | POA: Diagnosis not present
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
## 2023-09-23 NOTE — Patient Instructions (Signed)
Urge Incontinence  Ideal urination frequency is every 2-4 wakeful hours, which equates to 5-8 times within a 24-hour period.   Urge incontinence is leakage that occurs when the bladder muscle contracts, creating a sudden need to go before getting to the bathroom.   Going too often when your bladder isn't actually full can disrupt the body's automatic signals to store and hold urine longer, which will increase urgency/frequency.  In this case, the bladder "is running the show" and strategies can be learned to retrain this pattern.   One should be able to control the first urge to urinate, at around 150mL.  The bladder can hold up to a "grande latte," or 400mL. To help you gain control, practice the Urge Drill below when urgency strikes.  This drill will help retrain your bladder signals and allow you to store and hold urine longer.  The overall goal is to stretch out your time between voids to reach a more manageable voiding schedule.    Practice your "quick flicks" often throughout the day (each waking hour) even when you don't need feel the urge to go.  This will help strengthen your pelvic floor muscles, making them more effective in controlling leakage.  Urge Drill  When you feel an urge to go, follow these steps to regain control: Stop what you are doing and be still Take one deep breath, directing your air into your abdomen Think an affirming thought, such as "I've got this." Do 5 quick flicks of your pelvic floor Walk with control to the bathroom to void, or delay voiding     THE KNACK  The Knack is a strategy you may use to help to reduce or prevent leakage or passing of urine, gas or feces during an activity that causes downward force on the pelvic floor muscles.    Activities that can cause downward pressure on the pelvic floor muscles include coughing, sneezing, laughing, bending, lifting, and transitioning from different body positions such as from laying down to sitting up and  sitting to standing.  To perform The Knack, consciously squeeze and lift your pelvic floor muscles to perform a strong, well-timed pelvic muscle contraction BEFORE AND DURING these activities above.  As your contraction gets more coordinated and your muscles get stronger, you will become more effective in controlling your experience of incontinence or gas passing during these activities.      

## 2023-09-27 ENCOUNTER — Other Ambulatory Visit: Payer: Self-pay | Admitting: Medical Genetics

## 2023-09-30 ENCOUNTER — Ambulatory Visit: Payer: Medicare Other | Admitting: Physical Therapy

## 2023-09-30 DIAGNOSIS — R279 Unspecified lack of coordination: Secondary | ICD-10-CM | POA: Diagnosis not present

## 2023-09-30 DIAGNOSIS — R293 Abnormal posture: Secondary | ICD-10-CM | POA: Diagnosis not present

## 2023-09-30 DIAGNOSIS — N3946 Mixed incontinence: Secondary | ICD-10-CM | POA: Diagnosis not present

## 2023-09-30 DIAGNOSIS — M6281 Muscle weakness (generalized): Secondary | ICD-10-CM | POA: Diagnosis not present
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
## 2023-10-04 ENCOUNTER — Ambulatory Visit: Payer: Medicare Other | Admitting: Physical Therapy

## 2023-10-04 DIAGNOSIS — R279 Unspecified lack of coordination: Secondary | ICD-10-CM | POA: Diagnosis not present

## 2023-10-04 DIAGNOSIS — N3946 Mixed incontinence: Secondary | ICD-10-CM | POA: Diagnosis not present

## 2023-10-04 DIAGNOSIS — R293 Abnormal posture: Secondary | ICD-10-CM

## 2023-10-04 DIAGNOSIS — M6281 Muscle weakness (generalized): Secondary | ICD-10-CM | POA: Diagnosis not present
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
## 2023-10-05 ENCOUNTER — Other Ambulatory Visit (HOSPITAL_COMMUNITY): Payer: Self-pay

## 2023-10-07 ENCOUNTER — Encounter: Payer: Medicare Other | Admitting: Physical Therapy

## 2023-10-14 ENCOUNTER — Ambulatory Visit: Payer: Medicare Other | Admitting: Physical Therapy

## 2023-10-14 DIAGNOSIS — M6281 Muscle weakness (generalized): Secondary | ICD-10-CM

## 2023-10-14 DIAGNOSIS — R293 Abnormal posture: Secondary | ICD-10-CM | POA: Diagnosis not present

## 2023-10-14 DIAGNOSIS — R279 Unspecified lack of coordination: Secondary | ICD-10-CM | POA: Diagnosis not present

## 2023-10-14 DIAGNOSIS — N3946 Mixed incontinence: Secondary | ICD-10-CM | POA: Diagnosis not present
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
## 2023-10-21 ENCOUNTER — Ambulatory Visit: Payer: Medicare Other | Admitting: Physical Therapy

## 2023-10-28 ENCOUNTER — Ambulatory Visit: Payer: Medicare Other | Attending: Internal Medicine | Admitting: Physical Therapy

## 2023-10-28 DIAGNOSIS — M6281 Muscle weakness (generalized): Secondary | ICD-10-CM | POA: Diagnosis not present

## 2023-10-28 DIAGNOSIS — R293 Abnormal posture: Secondary | ICD-10-CM | POA: Diagnosis not present

## 2023-10-28 DIAGNOSIS — R279 Unspecified lack of coordination: Secondary | ICD-10-CM | POA: Diagnosis not present
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
## 2023-11-04 ENCOUNTER — Ambulatory Visit: Payer: Medicare Other | Admitting: Physical Therapy

## 2023-11-04 DIAGNOSIS — R279 Unspecified lack of coordination: Secondary | ICD-10-CM

## 2023-11-04 DIAGNOSIS — R293 Abnormal posture: Secondary | ICD-10-CM

## 2023-11-04 DIAGNOSIS — M6281 Muscle weakness (generalized): Secondary | ICD-10-CM

## 2023-11-04 NOTE — Patient Instructions (Signed)
Record of Bowel and Bladder Function ?Name         Date____________ ? ?Time of Day ? ? Type & Amount ?of Food & Fluid Intake Elimination ?U = Urinate ?BM= bowel movement type Amount of ?Leakage ?S /M /L ?S /P/T/ C Was ?Urge ?Present ?1 /2 /3 Activity With ?Leakage/ notes  ?Midnight       ?1:00 am       ?2:00 am       ?3:00 am        ?4:00 am       ?5:00 am       ?6:00 am       ?7:00 am       ?8:00 am       ?9:00 am       ?10:00 am       ?11:00 am       ?Noon       ?1:00 pm       ?2:00 pm       ?3:00 pm       ?4:00 pm       ?5:00 pm       ?6:00 pm       ?7:00 pm       ?8:00 pm       ?9:00 pm       ?10:00 pm       ?11:00 pm       ? ?Comments               ? ?Number of pads used today     ? ?
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
## 2023-11-11 ENCOUNTER — Ambulatory Visit: Payer: Medicare Other | Admitting: Physical Therapy

## 2023-11-11 DIAGNOSIS — R279 Unspecified lack of coordination: Secondary | ICD-10-CM | POA: Diagnosis not present

## 2023-11-11 DIAGNOSIS — M6281 Muscle weakness (generalized): Secondary | ICD-10-CM | POA: Diagnosis not present

## 2023-11-11 DIAGNOSIS — R293 Abnormal posture: Secondary | ICD-10-CM | POA: Diagnosis not present
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
## 2023-11-18 ENCOUNTER — Ambulatory Visit: Payer: Medicare Other | Attending: Internal Medicine | Admitting: Physical Therapy

## 2023-11-18 DIAGNOSIS — R293 Abnormal posture: Secondary | ICD-10-CM | POA: Diagnosis not present

## 2023-11-18 DIAGNOSIS — M6281 Muscle weakness (generalized): Secondary | ICD-10-CM | POA: Diagnosis not present

## 2023-11-18 DIAGNOSIS — R279 Unspecified lack of coordination: Secondary | ICD-10-CM | POA: Insufficient documentation
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
## 2023-12-16 DIAGNOSIS — K219 Gastro-esophageal reflux disease without esophagitis: Secondary | ICD-10-CM | POA: Diagnosis not present

## 2023-12-16 DIAGNOSIS — K449 Diaphragmatic hernia without obstruction or gangrene: Secondary | ICD-10-CM | POA: Diagnosis not present

## 2023-12-16 DIAGNOSIS — K580 Irritable bowel syndrome with diarrhea: Secondary | ICD-10-CM | POA: Diagnosis not present

## 2024-02-11 ENCOUNTER — Other Ambulatory Visit

## 2024-02-11 DIAGNOSIS — Z006 Encounter for examination for normal comparison and control in clinical research program: Secondary | ICD-10-CM

## 2024-02-29 LAB — GENECONNECT MOLECULAR SCREEN: Genetic Analysis Overall Interpretation: NEGATIVE

## 2024-03-07 ENCOUNTER — Ambulatory Visit (INDEPENDENT_AMBULATORY_CARE_PROVIDER_SITE_OTHER): Payer: Self-pay | Admitting: Internal Medicine

## 2024-03-07 ENCOUNTER — Encounter: Payer: Self-pay | Admitting: Internal Medicine

## 2024-03-07 VITALS — BP 118/70 | HR 70 | Temp 98.2°F | Ht 62.0 in | Wt 147.0 lb

## 2024-03-07 DIAGNOSIS — I700 Atherosclerosis of aorta: Secondary | ICD-10-CM | POA: Diagnosis not present

## 2024-03-07 DIAGNOSIS — K449 Diaphragmatic hernia without obstruction or gangrene: Secondary | ICD-10-CM | POA: Diagnosis not present

## 2024-03-07 DIAGNOSIS — Z79899 Other long term (current) drug therapy: Secondary | ICD-10-CM

## 2024-03-07 DIAGNOSIS — E7800 Pure hypercholesterolemia, unspecified: Secondary | ICD-10-CM

## 2024-03-07 NOTE — Progress Notes (Signed)
 I,[name], CMA,acting as a Neurosurgeon for Katherine LOISE Slocumb, MD.,have documented all relevant documentation on the behalf of Katherine LOISE Slocumb, MD,as directed by  Katherine LOISE Slocumb, MD while in the presence of Katherine LOISE Slocumb, MD.  Subjective:  Patient ID: Katherine Heath , female    DOB: [birth year] , 73 y.o.   MRN: [id number]  Chief Complaint  Patient presents with   Hyperlipidemia    Patient presents today for cholesterol follow up. She currently does not take any prescribed medications for cholesterol. Denies headache, chest pain & sob. She has no specific questions or concerns.     HPI Discussed the use of AI scribe software for clinical note transcription with the patient, who gave verbal consent to proceed.  History of Present Illness Samarie Kemmerer is a 73 year old female who presents for a routine follow-up visit.  She has a history of a moderate-sized hiatal hernia, identified during a cardiac calcium scoring that also revealed plaque in the aorta. She almost never experiences symptoms related to the hiatal hernia and follows reflux management guidelines, such as not eating three hours before lying down. She is currently taking pantoprazole  20 mg, prescribed by her gastroenterologist, and last saw the gastroenterologist a few months ago, although she cannot recall the exact date. There is no record of lab work being done during that visit.  Her hyperlipidemia is being monitored, with her last LDL cholesterol level recorded at 108 mg/dL in January. She is not currently taking a statin and prefers to manage her cholesterol naturally. She exercises regularly, walking almost every day and engaging in other forms of exercise about five days a week.  Past Medical History:  Diagnosis Date   Arthritis    Depression    GERD (gastroesophageal reflux disease)    Hyperlipidemia 06/17/2017   Shortness of breath 11/23/2015     Family History  Problem Relation Age of Onset   Heart disease Father     Heart attack Father    Heart disease Maternal Grandmother    Hypertension Maternal Grandmother    Heart disease Paternal Grandfather    Stroke Mother    Stroke Maternal Grandfather      Current Outpatient Medications:    b complex vitamins capsule, Take 1 capsule by mouth daily., Disp: , Rfl:    Cholecalciferol (VITAMIN D -3 PO), Take by mouth daily. 6000 iu daily, Disp: , Rfl:    pantoprazole  (PROTONIX ) 20 MG tablet, Take 1 tablet (20 mg total) by mouth daily., Disp: 30 tablet, Rfl: 0   VITAMIN K PO, Take by mouth., Disp: , Rfl:    NON FORMULARY, DIM (Patient not taking: Reported on 03/07/2024), Disp: , Rfl:    Red Yeast Rice Extract (RED YEAST RICE PO), Take 1 capsule by mouth 2 (two) times daily. (Patient not taking: Reported on 03/07/2024), Disp: , Rfl:    Allergies  Allergen Reactions   Latex Itching     Review of Systems  Constitutional: Negative.   Respiratory: Negative.    Cardiovascular: Negative.   Gastrointestinal: Negative.   Neurological: Negative.   Psychiatric/Behavioral: Negative.       Today's Vitals   03/07/24 0825  BP: 118/70  Pulse: 70  Temp: 98.2 F (36.8 C)  SpO2: 98%  Weight: 147 lb (66.7 kg)  Height: 5' 2 (1.575 m)   Body mass index is 26.89 kg/m.  Wt Readings from Last 3 Encounters:  03/07/24 147 lb (66.7 kg)  09/07/23 148 lb 3.2 oz (67.2  kg)  07/21/23 149 lb 9.6 oz (67.9 kg)     Objective:  Physical Exam Vitals and nursing note reviewed.  Constitutional:      Appearance: Normal appearance.  HENT:     Head: Normocephalic and atraumatic.  Eyes:     Extraocular Movements: Extraocular movements intact.  Cardiovascular:     Rate and Rhythm: Normal rate and regular rhythm.     Heart sounds: Normal heart sounds.  Pulmonary:     Effort: Pulmonary effort is normal.     Breath sounds: Normal breath sounds.  Musculoskeletal:     Cervical back: Normal range of motion.  Skin:    General: Skin is warm.  Neurological:     General: No  focal deficit present.     Mental Status: She is alert.  Psychiatric:        Mood and Affect: Mood normal.        Behavior: Behavior normal.         Assessment And Plan:  Pure hypercholesterolemia Assessment & Plan: Chronic, LDL goal is less than 70. LDL cholesterol was 108 mg/dL in January. Aortic plaque necessitates reducing LDL cholesterol to below 70 mg/dL. Prefers non-statin options and is interested in natural methods. - Recommend fiber supplements such as Benefiber or apple pectin to help lower cholesterol. - Schedule follow-up cholesterol check during next lab visit. - She is not fasting, agrees to rto within a week for labwork. Encouraged to follow heart healthy lifestyle.   Orders: -     CMP14+EGFR; Future -     Lipid panel; Future -     TSH; Future  Hiatal hernia Assessment & Plan: Chronic, moderately sized  incidental finding on cardiac CT.  She is currently on PPI therapy. Moderate-sized hiatal hernia with stomach protrusion into the chest.  - Continue pantoprazole  as prescribed. - Check B12 level due to potential absorption issues with pantoprazole . - Request medical records from Dr. Kristie for 2025.     Aortic atherosclerosis (HCC) Assessment & Plan: Chronic, LDL goal is less than 70.  She does not wish to take statin therapy. She is on RYR per her preference.   Orders: -     Lipid panel; Future  Drug therapy -     CBC; Future -     Vitamin B12; Future  General Health Maintenance Due for annual Medicare wellness visit. Last mammogram was in January, and bone density test was last year. Cardiac calcium score was zero, but there was plaque in the aorta. - Schedule Medicare wellness visit for December. - Schedule bone density test for next year. Return for 6 month chol f/u.SABRA  Patient was given opportunity to ask questions. Patient verbalized understanding of the plan and was able to repeat key elements of the plan. All questions were answered to their  satisfaction.   I, Katherine LOISE Slocumb, MD, have reviewed all documentation for this visit. The documentation on 03/07/24 for the exam, diagnosis, procedures, and orders are all accurate and complete.   IF YOU HAVE BEEN REFERRED TO A SPECIALIST, IT MAY TAKE 1-2 WEEKS TO SCHEDULE/PROCESS THE REFERRAL. IF YOU HAVE NOT HEARD FROM US /SPECIALIST IN TWO WEEKS, PLEASE GIVE US  A CALL AT [phone].   THE PATIENT IS ENCOURAGED TO PRACTICE SOCIAL DISTANCING DUE TO THE COVID-19 PANDEMIC.

## 2024-03-07 NOTE — Assessment & Plan Note (Signed)
 Chronic, LDL goal is less than 70.  She does not wish to take statin therapy. She is on RYR per her preference.

## 2024-03-07 NOTE — Patient Instructions (Signed)
 Cholesterol Content in Foods Cholesterol is a waxy, fat-like substance that helps to carry fat in the blood. The body needs cholesterol in small amounts, but too much cholesterol can cause damage to the arteries and heart. What foods have cholesterol?  Cholesterol is found in animal-based foods, such as meat, seafood, and dairy. Generally, low-fat dairy and lean meats have less cholesterol than full-fat dairy and fatty meats. The milligrams of cholesterol per serving (mg per serving) of common cholesterol-containing foods are listed below. Meats and other proteins Egg -- one large whole egg has 186 mg. Veal shank -- 4 oz (113 g) has 141 mg. Lean ground [place] (93% lean) -- 4 oz (113 g) has 118 mg. Fat-trimmed lamb loin -- 4 oz (113 g) has 106 mg. Lean ground beef (90% lean) -- 4 oz (113 g) has 100 mg. Lobster -- 3.5 oz (99 g) has 90 mg. Pork loin chops -- 4 oz (113 g) has 86 mg. Canned salmon -- 3.5 oz (99 g) has 83 mg. Fat-trimmed beef top loin -- 4 oz (113 g) has 78 mg. Frankfurter -- 1 frank (3.5 oz or 99 g) has 77 mg. Crab -- 3.5 oz (99 g) has 71 mg. Roasted chicken without skin, white meat -- 4 oz (113 g) has 66 mg. Light bologna -- 2 oz (57 g) has 45 mg. Deli-cut [place] -- 2 oz (57 g) has 31 mg. Canned tuna -- 3.5 oz (99 g) has 31 mg. Katherine Heath -- 1 oz (28 g) has 29 mg. Oysters and mussels (raw) -- 3.5 oz (99 g) has 25 mg. Mackerel -- 1 oz (28 g) has 22 mg. Trout -- 1 oz (28 g) has 20 mg. Pork sausage -- 1 link (1 oz or 28 g) has 17 mg. Salmon -- 1 oz (28 g) has 16 mg. Tilapia -- 1 oz (28 g) has 14 mg. Dairy Soft-serve ice cream --  cup (4 oz or 86 g) has 103 mg. Whole-milk yogurt -- 1 cup (8 oz or 245 g) has 29 mg. Cheddar cheese -- 1 oz (28 g) has 28 mg. American cheese -- 1 oz (28 g) has 28 mg. Whole milk -- 1 cup (8 oz or 250 mL) has 23 mg. 2% milk -- 1 cup (8 oz or 250 mL) has 18 mg. Cream cheese -- 1 tablespoon (Tbsp) (14.5 g) has 15 mg. Cottage cheese --  cup (4 oz or  113 g) has 14 mg. Low-fat (1%) milk -- 1 cup (8 oz or 250 mL) has 10 mg. Sour cream -- 1 Tbsp (12 g) has 8.5 mg. Low-fat yogurt -- 1 cup (8 oz or 245 g) has 8 mg. Nonfat Greek yogurt -- 1 cup (8 oz or 228 g) has 7 mg. Half-and-half cream -- 1 Tbsp (15 mL) has 5 mg. Fats and oils Cod liver oil -- 1 tablespoon (Tbsp) (13.6 g) has 82 mg. Butter -- 1 Tbsp (14 g) has 15 mg. Lard -- 1 Tbsp (12.8 g) has 14 mg. Bacon grease -- 1 Tbsp (12.9 g) has 14 mg. Mayonnaise -- 1 Tbsp (13.8 g) has 5-10 mg. Margarine -- 1 Tbsp (14 g) has 3-10 mg. The items listed above may not be a complete list of foods with cholesterol. Exact amounts of cholesterol in these foods may vary depending on specific ingredients and brands. Contact a dietitian for more information. What foods do not have cholesterol? Most plant-based foods do not have cholesterol unless you combine them with a food that has  cholesterol. Foods without cholesterol include: Grains and cereals. Vegetables. Fruits. Vegetable oils, such as olive, canola, and sunflower oil. Legumes, such as peas, beans, and lentils. Nuts and seeds. Egg whites. The items listed above may not be a complete list of foods that do not have cholesterol. Contact a dietitian for more information. Summary The body needs cholesterol in small amounts, but too much cholesterol can cause damage to the arteries and heart. Cholesterol is found in animal-based foods, such as meat, seafood, and dairy. Generally, low-fat dairy and lean meats have less cholesterol than full-fat dairy and fatty meats. This information is not intended to replace advice given to you by your health care provider. Make sure you discuss any questions you have with your health care provider. Document Revised: 12/13/2020 Document Reviewed: 12/13/2020 Elsevier Patient Education  2024 [employer].

## 2024-03-07 NOTE — Assessment & Plan Note (Addendum)
 Chronic, moderately sized  incidental finding on cardiac CT.  She is currently on PPI therapy. Moderate-sized hiatal hernia with stomach protrusion into the chest.  - Continue pantoprazole  as prescribed. - Check B12 level due to potential absorption issues with pantoprazole . - Request medical records from Dr. Kristie for 2025.

## 2024-03-07 NOTE — Assessment & Plan Note (Addendum)
 Chronic, LDL goal is less than 70. LDL cholesterol was 108 mg/dL in January. Aortic plaque necessitates reducing LDL cholesterol to below 70 mg/dL. Prefers non-statin options and is interested in natural methods. - Recommend fiber supplements such as Benefiber or apple pectin to help lower cholesterol. - Schedule follow-up cholesterol check during next lab visit. - She is not fasting, agrees to rto within a week for labwork. Encouraged to follow heart healthy lifestyle.

## 2024-03-09 ENCOUNTER — Other Ambulatory Visit

## 2024-03-09 DIAGNOSIS — I700 Atherosclerosis of aorta: Secondary | ICD-10-CM | POA: Diagnosis not present

## 2024-03-09 DIAGNOSIS — E7800 Pure hypercholesterolemia, unspecified: Secondary | ICD-10-CM

## 2024-03-09 DIAGNOSIS — Z79899 Other long term (current) drug therapy: Secondary | ICD-10-CM

## 2024-03-10 LAB — CMP14+EGFR
ALT: 14 IU/L (ref 0–32)
AST: 21 IU/L (ref 0–40)
Albumin: 4.3 g/dL (ref 3.8–4.8)
Alkaline Phosphatase: 91 IU/L (ref 44–121)
BUN/Creatinine Ratio: 16 (ref 12–28)
BUN: 14 mg/dL (ref 8–27)
Bilirubin Total: 0.5 mg/dL (ref 0.0–1.2)
CO2: 22 mmol/L (ref 20–29)
Calcium: 9.2 mg/dL (ref 8.7–10.3)
Chloride: 103 mmol/L (ref 96–106)
Creatinine, Ser: 0.87 mg/dL (ref 0.57–1.00)
Globulin, Total: 2.1 g/dL (ref 1.5–4.5)
Glucose: 83 mg/dL (ref 70–99)
Potassium: 4.6 mmol/L (ref 3.5–5.2)
Sodium: 142 mmol/L (ref 134–144)
Total Protein: 6.4 g/dL (ref 6.0–8.5)
eGFR: 70 mL/min/1.73 (ref >59)

## 2024-03-10 LAB — CBC
Hematocrit: 44.5 % (ref 34.0–46.6)
Hemoglobin: 14.2 g/dL (ref 11.1–15.9)
MCH: 31.1 pg (ref 26.6–33.0)
MCHC: 31.9 g/dL (ref 31.5–35.7)
MCV: 98 fL — ABNORMAL HIGH (ref 79–97)
Platelets: 299 x10E3/uL (ref 150–450)
RBC: 4.56 x10E6/uL (ref 3.77–5.28)
RDW: 12 % (ref 11.7–15.4)
WBC: 6.6 x10E3/uL (ref 3.4–10.8)

## 2024-03-10 LAB — LIPID PANEL
Chol/HDL Ratio: 4.2 ratio (ref 0.0–4.4)
Cholesterol, Total: 217 mg/dL — ABNORMAL HIGH (ref 100–199)
HDL: 52 mg/dL (ref >39)
LDL Chol Calc (NIH): 133 mg/dL — ABNORMAL HIGH (ref 0–99)
Triglycerides: 179 mg/dL — ABNORMAL HIGH (ref 0–149)
VLDL Cholesterol Cal: 32 mg/dL (ref 5–40)

## 2024-03-10 LAB — TSH: TSH: 1.34 u[IU]/mL (ref 0.450–4.500)

## 2024-03-10 LAB — VITAMIN B12: Vitamin B-12: 545 pg/mL (ref 232–1245)

## 2024-03-12 ENCOUNTER — Ambulatory Visit: Payer: Self-pay | Admitting: Internal Medicine

## 2024-04-27 DIAGNOSIS — Z23 Encounter for immunization: Secondary | ICD-10-CM | POA: Diagnosis not present

## 2024-05-17 DIAGNOSIS — Z23 Encounter for immunization: Secondary | ICD-10-CM | POA: Diagnosis not present

## 2024-07-20 DIAGNOSIS — H2511 Age-related nuclear cataract, right eye: Secondary | ICD-10-CM | POA: Diagnosis not present

## 2024-07-20 DIAGNOSIS — H25011 Cortical age-related cataract, right eye: Secondary | ICD-10-CM | POA: Diagnosis not present

## 2024-07-20 DIAGNOSIS — H25041 Posterior subcapsular polar age-related cataract, right eye: Secondary | ICD-10-CM | POA: Diagnosis not present

## 2024-07-20 DIAGNOSIS — H43813 Vitreous degeneration, bilateral: Secondary | ICD-10-CM | POA: Diagnosis not present

## 2024-07-21 ENCOUNTER — Ambulatory Visit: Payer: Self-pay

## 2024-07-21 DIAGNOSIS — Z0000 Encounter for general adult medical examination without abnormal findings: Secondary | ICD-10-CM | POA: Diagnosis not present

## 2024-07-21 NOTE — Progress Notes (Signed)
 No chief complaint on file.    Subjective:   Katherine Heath is a 73 y.o. female who presents for a Medicare Annual Wellness Visit.  Visit info / Clinical Intake: Interpreter Needed?: No  Fall Screening Falls in the past year?: 0 Number of falls in past year: 0 Was there an injury with Fall?: 0 Fall Risk Category Calculator: 0 Patient Fall Risk Level: Low Fall Risk  Fall Risk Patient at Risk for Falls Due to: No Fall Risks Fall risk Follow up: Falls evaluation completed  Advance Directives (For Healthcare) Does Patient Have a Medical Advance Directive?: Yes Does patient want to make changes to medical advance directive?: No - Patient declined Type of Advance Directive: Healthcare Power of [place]; Living will Copy of Healthcare Power of Attorney in Chart?: No - copy requested Copy of Living Will in Chart?: No - copy requested    Allergies (verified) Latex   Current Medications (verified) Outpatient Encounter Medications as of 07/21/2024  Medication Sig   b complex vitamins capsule Take 1 capsule by mouth daily.   Cholecalciferol (VITAMIN D -3 PO) Take by mouth daily. 6000 iu daily   NON FORMULARY DIM (Patient not taking: Reported on 03/07/2024)   pantoprazole  (PROTONIX ) 20 MG tablet Take 1 tablet (20 mg total) by mouth daily.   Red Yeast Rice Extract (RED YEAST RICE PO) Take 1 capsule by mouth 2 (two) times daily. (Patient not taking: Reported on 03/07/2024)   VITAMIN K PO Take by mouth.   No facility-administered encounter medications on file as of 07/21/2024.    History: Past Medical History:  Diagnosis Date   Arthritis    Depression    GERD (gastroesophageal reflux disease)    Hyperlipidemia 06/17/2017   Shortness of breath 11/23/2015   Past Surgical History:  Procedure Laterality Date   sphincterotomy  08/17/2001   WRIST SURGERY Left 06/2020   Family History  Problem Relation Age of Onset   Heart disease Father    Heart attack Father    Heart disease  Maternal Grandmother    Hypertension Maternal Grandmother    Heart disease Paternal Grandfather    Stroke Mother    Stroke Maternal Grandfather    Social History   Occupational History   Occupation: retired  Tobacco Use   Smoking status: Never   Smokeless tobacco: Never  Vaping Use   Vaping status: Never Used  Substance and Sexual Activity   Alcohol use: Yes    Comment: Occ   Drug use: No   Sexual activity: Not Currently   Tobacco Counseling Counseling given: Not Answered  SDOH Screenings   Food Insecurity: No Food Insecurity (07/21/2023)  Housing: Low Risk  (07/21/2023)  Transportation Needs: No Transportation Needs (07/21/2023)  Utilities: Not At Risk (07/21/2023)  Alcohol Screen: Low Risk  (07/21/2023)  Depression (PHQ2-9): Low Risk  (03/07/2024)  Financial Resource Strain: Low Risk  (07/21/2023)  Physical Activity: Sufficiently Active (07/21/2023)  Social Connections: Moderately Isolated (07/21/2023)  Stress: Stress Concern Present (07/21/2023)  Tobacco Use: Low Risk  (03/07/2024)  Health Literacy: Adequate Health Literacy (07/21/2023)   See flowsheets for full screening details  Depression Screen PHQ 2 & 9 Depression Scale- Over the past 2 weeks, how often have you been bothered by any of the following problems? Little interest or pleasure in doing things: 0 Feeling down, depressed, or hopeless (PHQ Adolescent also includes...irritable): 0 PHQ-2 Total Score: 0 Trouble falling or staying asleep, or sleeping too much: 0 Feeling tired or having little energy: 0  Poor appetite or overeating (PHQ Adolescent also includes...weight loss): 0 Feeling bad about yourself - or that you are a failure or have let yourself or your family down: 0 Trouble concentrating on things, such as reading the newspaper or watching television (PHQ Adolescent also includes...like school work): 0 Moving or speaking so slowly that other people could have noticed. Or the opposite - being so fidgety or  restless that you have been moving around a lot more than usual: 0 Thoughts that you would be better off dead, or of hurting yourself in some way: 0 PHQ-9 Total Score: 0 If you checked off any problems, how difficult have these problems made it for you to do your work, take care of things at home, or get along with other people?: Not difficult at all     Goals Addressed   None          Objective:    There were no vitals filed for this visit. There is no height or weight on file to calculate BMI.  Hearing/Vision screen No results found. Immunizations and Health Maintenance Health Maintenance  Topic Date Due   COVID-19 Vaccine (8 - 2025-26 season) 04/17/2024   Medicare Annual Wellness (AWV)  07/20/2024   Mammogram  08/19/2025   Colonoscopy  01/02/2026   DTaP/Tdap/Td (2 - Td or Tdap) 12/13/2028   Pneumococcal Vaccine: 50+ Years  Completed   Influenza Vaccine  Completed   Bone Density Scan  Completed   Hepatitis C Screening  Completed   Zoster Vaccines- Shingrix   Completed   Meningococcal B Vaccine  Aged Out        Assessment/Plan:  This is a routine wellness examination for Katherine Heath.  Patient Care Team: Jarold Medici, MD as PCP - General (Internal Medicine)  I have personally reviewed and noted the following in the patient's chart:   Medical and social history Use of alcohol, tobacco or illicit drugs  Current medications and supplements including opioid prescriptions. Functional ability and status Nutritional status Physical activity Advanced directives List of other physicians Hospitalizations, surgeries, and ER visits in previous 12 months Vitals Screenings to include cognitive, depression, and falls Referrals and appointments  No orders of the defined types were placed in this encounter.  In addition, I have reviewed and discussed with patient certain preventive protocols, quality metrics, and best practice recommendations. A written personalized care plan  for preventive services as well as general preventive health recommendations were provided to patient.   Katherine Heath JINNY Lunger, CMA   07/21/2024   No follow-ups on file.  After Visit Summary: (MyChart) Due to this being a telephonic visit, the after visit summary with patients personalized plan was offered to patient via MyChart   Nurse Notes:

## 2024-08-07 ENCOUNTER — Other Ambulatory Visit: Payer: Self-pay | Admitting: Internal Medicine

## 2024-08-07 DIAGNOSIS — Z1231 Encounter for screening mammogram for malignant neoplasm of breast: Secondary | ICD-10-CM

## 2024-08-23 ENCOUNTER — Ambulatory Visit: Payer: Medicare Other

## 2024-08-29 ENCOUNTER — Encounter: Payer: Self-pay | Admitting: Internal Medicine

## 2024-08-29 ENCOUNTER — Ambulatory Visit (INDEPENDENT_AMBULATORY_CARE_PROVIDER_SITE_OTHER): Payer: Self-pay | Admitting: Internal Medicine

## 2024-08-29 VITALS — BP 102/60 | HR 64 | Temp 97.6°F | Ht 62.0 in | Wt 148.0 lb

## 2024-08-29 DIAGNOSIS — R232 Flushing: Secondary | ICD-10-CM

## 2024-08-29 DIAGNOSIS — E7800 Pure hypercholesterolemia, unspecified: Secondary | ICD-10-CM | POA: Diagnosis not present

## 2024-08-29 DIAGNOSIS — I700 Atherosclerosis of aorta: Secondary | ICD-10-CM | POA: Diagnosis not present

## 2024-08-29 DIAGNOSIS — F5101 Primary insomnia: Secondary | ICD-10-CM

## 2024-08-29 DIAGNOSIS — J3489 Other specified disorders of nose and nasal sinuses: Secondary | ICD-10-CM | POA: Diagnosis not present

## 2024-08-29 DIAGNOSIS — E559 Vitamin D deficiency, unspecified: Secondary | ICD-10-CM

## 2024-08-29 MED ORDER — TRAZODONE HCL 50 MG PO TABS: 50.0000 mg | ORAL_TABLET | Freq: Every day | ORAL | 1 refills | Status: AC

## 2024-08-29 NOTE — Patient Instructions (Signed)

## 2024-08-29 NOTE — Progress Notes (Signed)
 I,[name], CMA,acting as a neurosurgeon for Katherine LOISE Slocumb, MD.,have documented all relevant documentation on the behalf of Katherine LOISE Slocumb, MD,as directed by  Katherine LOISE Slocumb, MD while in the presence of Katherine LOISE Slocumb, MD.  Subjective:  Patient ID: Katherine Heath , female    DOB: [birth year] , 74 y.o.   MRN: [id number]  Chief Complaint  Patient presents with   Hyperlipidemia    Patient presents today for a chol check. Patient reports compliance with her meds. Patient doesn't have any questions or concerns at this time.   sores    Patient reports she has sores under nose that wont go away.   Insomnia    Patient reports she has not been able to sleep and she is wondering if she needs to go back to start taking hormones.    HPI Discussed the use of AI scribe software for clinical note transcription with the patient, who gave verbal consent to proceed.  History of Present Illness Katherine Heath is a 74 year old female who presents for a cholesterol check and evaluation of nasal sores and sleep disturbances.  She has been experiencing nasal sores for approximately two months. These sores are described as 'little sores like cold sore kind of things' that have not fully healed, showing slight improvement before flaring up again. She has been applying a product called Fix to the sores. Additionally, she uses a device called a Mute, recommended by her dentist, which fits inside her nostrils to keep them open at night. She has used this device for five or six years without previous issues but wonders if it might be contributing to the sores. She attempted to stop using the [facility] for a couple of nights but did not notice any improvement.  She experiences sleep disturbances, characterized by difficulty falling asleep on some nights, although she sleeps well on others. She attempts to go to bed by 8:15 PM, noting that going to bed later makes it harder to settle down. Her sleep was better when she was  on hormone therapy, but it has worsened since discontinuing it. She is hesitant to take medication for sleep due to past experiences with medications like Ambien and possibly Lunesta, which left her feeling 'weird' upon waking.  She also reports experiencing hot flashes, which have been a persistent issue. These improved significantly with hormone therapy but have become more bothersome again. She expresses frustration with the ongoing nature of the hot flashes.  Her current supplements include B complex, D3, Omega 3s, K2, a probiotic, and magnesium glycinate. She has a history of teeth grinding, which she manages with the [facility] device, and has had a sleep study in the past.      Past Medical History:  Diagnosis Date   Arthritis    Depression    GERD (gastroesophageal reflux disease)    Hyperlipidemia 06/17/2017   Shortness of breath 11/23/2015     Family History  Problem Relation Age of Onset   Heart disease Father    Heart attack Father    Heart disease Maternal Grandmother    Hypertension Maternal Grandmother    Heart disease Paternal Grandfather    Stroke Mother    Stroke Maternal Grandfather     Current Medications[1]   Allergies[2]   Review of Systems  Constitutional: Negative.   Respiratory: Negative.    Cardiovascular: Negative.   Gastrointestinal: Negative.   Psychiatric/Behavioral:  Positive for sleep disturbance.      Today's Vitals  08/29/24 0817  BP: 102/60  Pulse: 64  Temp: 97.6 F (36.4 C)  TempSrc: Oral  Weight: 148 lb (67.1 kg)  Height: 5' 2 (1.575 m)  PainSc: 2   PainLoc: Knee   Body mass index is 27.07 kg/m.  Wt Readings from Last 3 Encounters:  08/29/24 148 lb (67.1 kg)  03/07/24 147 lb (66.7 kg)  09/07/23 148 lb 3.2 oz (67.2 kg)    The 10-year ASCVD risk score (Arnett DK, et al., 2019) is: 8.8%   Values used to calculate the score:     Age: 89 years     Clinically relevant sex: Female     Is Non-Hispanic African American: No      Diabetic: No     Tobacco smoker: No     Systolic Blood Pressure: 102 mmHg     Is BP treated: No     HDL Cholesterol: 69 mg/dL     Total Cholesterol: 262 mg/dL  Objective:  Physical Exam Vitals and nursing note reviewed.  Constitutional:      Appearance: Normal appearance.  HENT:     Head: Normocephalic and atraumatic.     Nose: Nasal tenderness and mucosal edema present.     Comments: Abrasions b/l inside nostrils Eyes:     Extraocular Movements: Extraocular movements intact.  Cardiovascular:     Rate and Rhythm: Normal rate and regular rhythm.     Heart sounds: Normal heart sounds.  Pulmonary:     Effort: Pulmonary effort is normal.     Breath sounds: Normal breath sounds.  Musculoskeletal:     Cervical back: Normal range of motion.  Skin:    General: Skin is warm.  Neurological:     General: No focal deficit present.     Mental Status: She is alert.  Psychiatric:        Mood and Affect: Mood normal.        Behavior: Behavior normal.         Assessment And Plan:   Assessment & Plan Pure hypercholesterolemia Chronic, LDL goal is less than 70.  Aortic plaque necessitates reducing LDL cholesterol to below 70 mg/dL. Prefers non-statin options and is interested in natural methods. - Recommend fiber supplements such as Benefiber or apple pectin to help lower cholesterol. - Continue with RYR Aortic atherosclerosis Chronic, LDL goal is less than 70.  She does not wish to take statin therapy. She is on RYR per her preference.  Sore in nose Chronic nasal sores possibly due to mute device irritation or infection. - Prescribed antibacterial cream twice daily with Q-tip. - Advised against mute device use while sores persist. - Consider ENT referral if sores persist. Primary insomnia Chronic insomnia worsened post-hormone therapy discontinuation. Previous sedatives poorly tolerated. Discussed alternative sleep-inducing medications initially used for mood disorders. -  Prescribed safer, more effective sleep medication. Hot flashes Chronic hot flashes, previously managed with hormone therapy. Evaluating non-hormonal causes. - Ordered blood tests for thyroid  function and blood count. Vitamin D  deficiency disease Vitamin D  supplementation ongoing.    Orders Placed This Encounter  Procedures   CMP14+EGFR   Lipid panel   CBC with Diff   Vitamin D  (25 hydroxy)   TSH   Return in about 6 weeks (around 10/10/2024), or med check f/u sleep, for six month - chol check.  Patient was given opportunity to ask questions. Patient verbalized understanding of the plan and was able to repeat key elements of the plan. All questions were answered to their  satisfaction.   I, Katherine LOISE Slocumb, MD, have reviewed all documentation for this visit. The documentation on 08/29/2024 for the exam, diagnosis, procedures, and orders are all accurate and complete.   IF YOU HAVE BEEN REFERRED TO A SPECIALIST, IT MAY TAKE 1-2 WEEKS TO SCHEDULE/PROCESS THE REFERRAL. IF YOU HAVE NOT HEARD FROM US /SPECIALIST IN TWO WEEKS, PLEASE GIVE US  A CALL AT [phone].      [1]  Current Outpatient Medications:    b complex vitamins capsule, Take 1 capsule by mouth daily., Disp: , Rfl:    Cholecalciferol (VITAMIN D -3 PO), Take by mouth daily. 6000 iu daily, Disp: , Rfl:    FIBER COMPLETE PO, Take by mouth., Disp: , Rfl:    Omega 3-6-9 Fatty Acids (OMEGA 3-6-9 COMPLEX PO), Take by mouth., Disp: , Rfl:    pantoprazole  (PROTONIX ) 20 MG tablet, Take 1 tablet (20 mg total) by mouth daily., Disp: 30 tablet, Rfl: 0   traZODone  (DESYREL ) 50 MG tablet, Take 1 tablet (50 mg total) by mouth at bedtime., Disp: 30 tablet, Rfl: 1   VITAMIN K PO, Take by mouth., Disp: , Rfl:    NON FORMULARY, DIM (Patient not taking: Reported on 07/21/2024), Disp: , Rfl:    Red Yeast Rice Extract (RED YEAST RICE PO), Take 1 capsule by mouth 2 (two) times daily. (Patient not taking: Reported on 08/29/2024), Disp: , Rfl:  [2]   Allergies Allergen Reactions   Latex Itching

## 2024-08-30 LAB — LIPID PANEL
Chol/HDL Ratio: 3.8 ratio (ref 0.0–4.4)
Cholesterol, Total: 262 mg/dL — ABNORMAL HIGH (ref 100–199)
HDL: 69 mg/dL
LDL Chol Calc (NIH): 169 mg/dL — ABNORMAL HIGH (ref 0–99)
Triglycerides: 133 mg/dL (ref 0–149)
VLDL Cholesterol Cal: 24 mg/dL (ref 5–40)

## 2024-08-30 LAB — CBC WITH DIFFERENTIAL/PLATELET
Basophils Absolute: 0.1 x10E3/uL (ref 0.0–0.2)
Basos: 1 %
EOS (ABSOLUTE): 0.4 x10E3/uL (ref 0.0–0.4)
Eos: 6 %
Hematocrit: 46.3 % (ref 34.0–46.6)
Hemoglobin: 15.2 g/dL (ref 11.1–15.9)
Immature Grans (Abs): 0 x10E3/uL (ref 0.0–0.1)
Immature Granulocytes: 0 %
Lymphocytes Absolute: 1.9 x10E3/uL (ref 0.7–3.1)
Lymphs: 30 %
MCH: 31.9 pg (ref 26.6–33.0)
MCHC: 32.8 g/dL (ref 31.5–35.7)
MCV: 97 fL (ref 79–97)
Monocytes Absolute: 0.7 x10E3/uL (ref 0.1–0.9)
Monocytes: 11 %
Neutrophils Absolute: 3.4 x10E3/uL (ref 1.4–7.0)
Neutrophils: 52 %
Platelets: 309 x10E3/uL (ref 150–450)
RBC: 4.77 x10E6/uL (ref 3.77–5.28)
RDW: 12.3 % (ref 11.7–15.4)
WBC: 6.4 x10E3/uL (ref 3.4–10.8)

## 2024-08-30 LAB — CMP14+EGFR
ALT: 15 IU/L (ref 0–32)
AST: 24 IU/L (ref 0–40)
Albumin: 4.5 g/dL (ref 3.8–4.8)
Alkaline Phosphatase: 93 IU/L (ref 49–135)
BUN/Creatinine Ratio: 22 (ref 12–28)
BUN: 17 mg/dL (ref 8–27)
Bilirubin Total: 0.6 mg/dL (ref 0.0–1.2)
CO2: 23 mmol/L (ref 20–29)
Calcium: 9.5 mg/dL (ref 8.7–10.3)
Chloride: 100 mmol/L (ref 96–106)
Creatinine, Ser: 0.78 mg/dL (ref 0.57–1.00)
Globulin, Total: 2.3 g/dL (ref 1.5–4.5)
Glucose: 97 mg/dL (ref 70–99)
Potassium: 4.6 mmol/L (ref 3.5–5.2)
Sodium: 138 mmol/L (ref 134–144)
Total Protein: 6.8 g/dL (ref 6.0–8.5)
eGFR: 80 mL/min/1.73

## 2024-08-30 LAB — VITAMIN D 25 HYDROXY (VIT D DEFICIENCY, FRACTURES): Vit D, 25-Hydroxy: 52.2 ng/mL (ref 30.0–100.0)

## 2024-08-30 LAB — TSH: TSH: 1.32 u[IU]/mL (ref 0.450–4.500)

## 2024-09-01 ENCOUNTER — Ambulatory Visit
Admission: RE | Admit: 2024-09-01 | Discharge: 2024-09-01 | Disposition: A | Discharge status: Home / Self Care | Source: Ambulatory Visit | Attending: Internal Medicine | Admitting: Internal Medicine

## 2024-09-01 DIAGNOSIS — Z1231 Encounter for screening mammogram for malignant neoplasm of breast: Secondary | ICD-10-CM

## 2024-09-03 ENCOUNTER — Ambulatory Visit: Payer: Self-pay | Admitting: Internal Medicine

## 2024-09-04 NOTE — Assessment & Plan Note (Signed)
 Chronic, LDL goal is less than 70.  She does not wish to take statin therapy. She is on RYR per her preference.

## 2024-09-04 NOTE — Assessment & Plan Note (Signed)
 Chronic, LDL goal is less than 70.  Aortic plaque necessitates reducing LDL cholesterol to below 70 mg/dL. Prefers non-statin options and is interested in natural methods. - Recommend fiber supplements such as Benefiber or apple pectin to help lower cholesterol. - Continue with RYR

## 2024-10-11 ENCOUNTER — Ambulatory Visit: Payer: Self-pay | Admitting: Internal Medicine

## 2025-03-01 ENCOUNTER — Ambulatory Visit: Payer: Self-pay | Admitting: Internal Medicine

## 2025-08-15 ENCOUNTER — Ambulatory Visit
